# Patient Record
Sex: Male | Born: 1940 | Race: White | Hispanic: No | Marital: Married | Smoking: Former smoker
Health system: Southern US, Community
[De-identification: ages and names within clinical notes are randomized; demographics above are authoritative.]

## PROBLEM LIST (undated history)

## (undated) DIAGNOSIS — N400 Enlarged prostate without lower urinary tract symptoms: Secondary | ICD-10-CM

## (undated) DIAGNOSIS — K5792 Diverticulitis of intestine, part unspecified, without perforation or abscess without bleeding: Secondary | ICD-10-CM

## (undated) DIAGNOSIS — R51 Headache: Secondary | ICD-10-CM

## (undated) DIAGNOSIS — F0390 Unspecified dementia without behavioral disturbance: Secondary | ICD-10-CM

## (undated) HISTORY — DX: Headache: R51

## (undated) HISTORY — PX: CHOLECYSTECTOMY: SHX55

## (undated) HISTORY — DX: Benign prostatic hyperplasia without lower urinary tract symptoms: N40.0

## (undated) HISTORY — PX: SMALL INTESTINE SURGERY: SHX150

## (undated) HISTORY — PX: HERNIA REPAIR: SHX51

## (undated) HISTORY — PX: TOTAL KNEE ARTHROPLASTY: SHX125

## (undated) HISTORY — PX: JOINT REPLACEMENT: SHX530

---

## 2001-01-26 ENCOUNTER — Other Ambulatory Visit: Admission: RE | Admit: 2001-01-26 | Discharge: 2001-01-26 | Payer: Self-pay | Admitting: Pediatrics

## 2003-07-07 ENCOUNTER — Emergency Department (HOSPITAL_COMMUNITY): Admission: EM | Admit: 2003-07-07 | Discharge: 2003-07-07 | Payer: Self-pay | Admitting: Emergency Medicine

## 2003-07-12 ENCOUNTER — Ambulatory Visit (HOSPITAL_COMMUNITY): Admission: RE | Admit: 2003-07-12 | Discharge: 2003-07-12 | Payer: Self-pay | Admitting: Pediatrics

## 2003-11-04 ENCOUNTER — Ambulatory Visit (HOSPITAL_COMMUNITY): Admission: RE | Admit: 2003-11-04 | Discharge: 2003-11-04 | Payer: Self-pay | Admitting: General Surgery

## 2003-11-06 ENCOUNTER — Ambulatory Visit (HOSPITAL_COMMUNITY): Admission: RE | Admit: 2003-11-06 | Discharge: 2003-11-06 | Payer: Self-pay | Admitting: General Surgery

## 2005-04-30 ENCOUNTER — Emergency Department (HOSPITAL_COMMUNITY): Admission: EM | Admit: 2005-04-30 | Discharge: 2005-04-30 | Payer: Self-pay | Admitting: Emergency Medicine

## 2005-05-07 ENCOUNTER — Ambulatory Visit (HOSPITAL_COMMUNITY): Admission: RE | Admit: 2005-05-07 | Discharge: 2005-05-07 | Payer: Self-pay | Admitting: General Surgery

## 2006-05-04 ENCOUNTER — Encounter (INDEPENDENT_AMBULATORY_CARE_PROVIDER_SITE_OTHER): Payer: Self-pay | Admitting: Family Medicine

## 2006-05-04 LAB — CONVERTED CEMR LAB: Blood Glucose, Fasting: 90 mg/dL

## 2006-05-27 ENCOUNTER — Encounter (INDEPENDENT_AMBULATORY_CARE_PROVIDER_SITE_OTHER): Payer: Self-pay | Admitting: *Deleted

## 2006-05-27 ENCOUNTER — Ambulatory Visit: Payer: Self-pay | Admitting: Family Medicine

## 2006-05-31 ENCOUNTER — Ambulatory Visit (HOSPITAL_COMMUNITY): Admission: RE | Admit: 2006-05-31 | Discharge: 2006-05-31 | Payer: Self-pay | Admitting: Family Medicine

## 2006-05-31 ENCOUNTER — Encounter (INDEPENDENT_AMBULATORY_CARE_PROVIDER_SITE_OTHER): Payer: Self-pay | Admitting: Family Medicine

## 2006-05-31 LAB — CONVERTED CEMR LAB
Albumin: 4.3 g/dL
Alkaline Phosphatase: 51 units/L
BUN: 13 mg/dL
Calcium: 9.1 mg/dL
Cholesterol: 191 mg/dL
Creatinine, Ser: 0.98 mg/dL
HDL: 46 mg/dL
LDL Cholesterol: 122 mg/dL
PSA: 0.27 ng/mL
PSA: 0.27 ng/mL
RBC count: 4.88 10*6/uL
TSH: 1.302 microintl units/mL
TSH: 1.302 microintl units/mL
WBC, blood: 9.9 10*3/uL

## 2006-06-13 ENCOUNTER — Ambulatory Visit: Payer: Self-pay | Admitting: Family Medicine

## 2006-07-02 ENCOUNTER — Ambulatory Visit: Admission: RE | Admit: 2006-07-02 | Discharge: 2006-07-02 | Payer: Self-pay | Admitting: Family Medicine

## 2006-07-15 ENCOUNTER — Ambulatory Visit: Payer: Self-pay | Admitting: Family Medicine

## 2006-07-19 ENCOUNTER — Encounter: Payer: Self-pay | Admitting: Family Medicine

## 2006-07-19 DIAGNOSIS — F411 Generalized anxiety disorder: Secondary | ICD-10-CM | POA: Insufficient documentation

## 2006-07-19 DIAGNOSIS — M1990 Unspecified osteoarthritis, unspecified site: Secondary | ICD-10-CM | POA: Insufficient documentation

## 2006-07-19 DIAGNOSIS — K429 Umbilical hernia without obstruction or gangrene: Secondary | ICD-10-CM | POA: Insufficient documentation

## 2006-07-19 DIAGNOSIS — K439 Ventral hernia without obstruction or gangrene: Secondary | ICD-10-CM | POA: Insufficient documentation

## 2006-07-19 DIAGNOSIS — E669 Obesity, unspecified: Secondary | ICD-10-CM

## 2006-07-19 DIAGNOSIS — E785 Hyperlipidemia, unspecified: Secondary | ICD-10-CM | POA: Insufficient documentation

## 2006-07-19 DIAGNOSIS — K5900 Constipation, unspecified: Secondary | ICD-10-CM

## 2006-07-19 DIAGNOSIS — Z8719 Personal history of other diseases of the digestive system: Secondary | ICD-10-CM | POA: Insufficient documentation

## 2006-07-19 DIAGNOSIS — K219 Gastro-esophageal reflux disease without esophagitis: Secondary | ICD-10-CM

## 2006-07-19 DIAGNOSIS — M545 Low back pain: Secondary | ICD-10-CM

## 2006-07-21 ENCOUNTER — Ambulatory Visit: Payer: Self-pay | Admitting: Pulmonary Disease

## 2006-07-22 ENCOUNTER — Observation Stay (HOSPITAL_COMMUNITY): Admission: RE | Admit: 2006-07-22 | Discharge: 2006-07-22 | Payer: Self-pay | Admitting: General Surgery

## 2006-07-23 ENCOUNTER — Emergency Department (HOSPITAL_COMMUNITY): Admission: EM | Admit: 2006-07-23 | Discharge: 2006-07-23 | Payer: Self-pay | Admitting: Emergency Medicine

## 2006-08-01 ENCOUNTER — Inpatient Hospital Stay (HOSPITAL_COMMUNITY): Admission: EM | Admit: 2006-08-01 | Discharge: 2006-08-02 | Payer: Self-pay | Admitting: Emergency Medicine

## 2006-08-02 HISTORY — PX: COLONOSCOPY: SHX174

## 2006-09-15 ENCOUNTER — Ambulatory Visit: Payer: Self-pay | Admitting: Family Medicine

## 2006-09-15 DIAGNOSIS — G4733 Obstructive sleep apnea (adult) (pediatric): Secondary | ICD-10-CM

## 2006-09-16 ENCOUNTER — Encounter (INDEPENDENT_AMBULATORY_CARE_PROVIDER_SITE_OTHER): Payer: Self-pay | Admitting: Family Medicine

## 2006-09-23 ENCOUNTER — Telehealth (INDEPENDENT_AMBULATORY_CARE_PROVIDER_SITE_OTHER): Payer: Self-pay | Admitting: Family Medicine

## 2006-09-28 ENCOUNTER — Ambulatory Visit: Payer: Self-pay | Admitting: Family Medicine

## 2006-09-28 ENCOUNTER — Telehealth (INDEPENDENT_AMBULATORY_CARE_PROVIDER_SITE_OTHER): Payer: Self-pay | Admitting: Family Medicine

## 2006-09-28 LAB — CONVERTED CEMR LAB
Inflenza A Ag: NEGATIVE
Rapid Strep: NEGATIVE

## 2006-10-19 ENCOUNTER — Telehealth (INDEPENDENT_AMBULATORY_CARE_PROVIDER_SITE_OTHER): Payer: Self-pay | Admitting: Family Medicine

## 2006-10-20 ENCOUNTER — Ambulatory Visit: Payer: Self-pay | Admitting: Family Medicine

## 2006-10-21 ENCOUNTER — Encounter (INDEPENDENT_AMBULATORY_CARE_PROVIDER_SITE_OTHER): Payer: Self-pay | Admitting: Family Medicine

## 2006-11-03 ENCOUNTER — Encounter (INDEPENDENT_AMBULATORY_CARE_PROVIDER_SITE_OTHER): Payer: Self-pay | Admitting: Family Medicine

## 2006-11-08 ENCOUNTER — Ambulatory Visit: Payer: Self-pay | Admitting: Family Medicine

## 2006-11-08 LAB — CONVERTED CEMR LAB
Bilirubin Urine: NEGATIVE
Glucose, Urine, Semiquant: NEGATIVE
Ketones, urine, test strip: NEGATIVE
Nitrite: NEGATIVE
Protein, U semiquant: NEGATIVE
Specific Gravity, Urine: 1.02
Urobilinogen, UA: 0.2
WBC Urine, dipstick: NEGATIVE
pH: 7.5

## 2006-11-11 ENCOUNTER — Ambulatory Visit (HOSPITAL_COMMUNITY): Admission: RE | Admit: 2006-11-11 | Discharge: 2006-11-11 | Payer: Self-pay | Admitting: Family Medicine

## 2006-11-16 ENCOUNTER — Telehealth (INDEPENDENT_AMBULATORY_CARE_PROVIDER_SITE_OTHER): Payer: Self-pay | Admitting: *Deleted

## 2006-11-17 ENCOUNTER — Ambulatory Visit: Payer: Self-pay | Admitting: Family Medicine

## 2006-11-18 ENCOUNTER — Encounter (INDEPENDENT_AMBULATORY_CARE_PROVIDER_SITE_OTHER): Payer: Self-pay | Admitting: Family Medicine

## 2006-11-22 ENCOUNTER — Ambulatory Visit: Payer: Self-pay | Admitting: Internal Medicine

## 2006-11-22 ENCOUNTER — Encounter (INDEPENDENT_AMBULATORY_CARE_PROVIDER_SITE_OTHER): Payer: Self-pay | Admitting: Family Medicine

## 2006-11-28 ENCOUNTER — Ambulatory Visit (HOSPITAL_COMMUNITY): Admission: RE | Admit: 2006-11-28 | Discharge: 2006-11-28 | Payer: Self-pay | Admitting: Internal Medicine

## 2006-11-28 ENCOUNTER — Ambulatory Visit: Payer: Self-pay | Admitting: Internal Medicine

## 2006-11-28 ENCOUNTER — Encounter (INDEPENDENT_AMBULATORY_CARE_PROVIDER_SITE_OTHER): Payer: Self-pay | Admitting: Specialist

## 2007-01-16 ENCOUNTER — Ambulatory Visit: Payer: Self-pay | Admitting: Internal Medicine

## 2007-01-16 ENCOUNTER — Encounter (INDEPENDENT_AMBULATORY_CARE_PROVIDER_SITE_OTHER): Payer: Self-pay | Admitting: Family Medicine

## 2007-01-23 ENCOUNTER — Telehealth (INDEPENDENT_AMBULATORY_CARE_PROVIDER_SITE_OTHER): Payer: Self-pay | Admitting: Family Medicine

## 2007-02-16 ENCOUNTER — Ambulatory Visit: Payer: Self-pay | Admitting: Family Medicine

## 2007-02-17 ENCOUNTER — Ambulatory Visit (HOSPITAL_COMMUNITY): Admission: RE | Admit: 2007-02-17 | Discharge: 2007-02-17 | Payer: Self-pay | Admitting: Family Medicine

## 2007-02-18 ENCOUNTER — Encounter (INDEPENDENT_AMBULATORY_CARE_PROVIDER_SITE_OTHER): Payer: Self-pay | Admitting: Family Medicine

## 2007-02-19 LAB — CONVERTED CEMR LAB
ALT: 21 units/L (ref 0–53)
AST: 23 units/L (ref 0–37)
Albumin: 4.5 g/dL (ref 3.5–5.2)
Alkaline Phosphatase: 70 units/L (ref 39–117)
Amylase: 64 units/L (ref 0–105)
BUN: 13 mg/dL (ref 6–23)
Basophils Absolute: 0.1 10*3/uL (ref 0.0–0.1)
Basophils Relative: 1 % (ref 0–1)
CO2: 22 meq/L (ref 19–32)
Calcium: 9.6 mg/dL (ref 8.4–10.5)
Chloride: 106 meq/L (ref 96–112)
Creatinine, Ser: 0.98 mg/dL (ref 0.40–1.50)
Eosinophils Absolute: 0.3 10*3/uL (ref 0.0–0.7)
Eosinophils Relative: 3 % (ref 0–5)
Glucose, Bld: 103 mg/dL — ABNORMAL HIGH (ref 70–99)
HCT: 47.1 % (ref 39.0–52.0)
Hemoglobin: 15 g/dL (ref 13.0–17.0)
Lipase: 34 units/L (ref 0–75)
Lymphocytes Relative: 31 % (ref 12–46)
Lymphs Abs: 3.2 10*3/uL (ref 0.7–3.3)
MCHC: 31.8 g/dL (ref 30.0–36.0)
MCV: 99.4 fL (ref 78.0–100.0)
Monocytes Absolute: 0.7 10*3/uL (ref 0.2–0.7)
Monocytes Relative: 7 % (ref 3–11)
Neutro Abs: 6.1 10*3/uL (ref 1.7–7.7)
Neutrophils Relative %: 59 % (ref 43–77)
Platelets: 247 10*3/uL (ref 150–400)
Potassium: 4.4 meq/L (ref 3.5–5.3)
RBC: 4.74 M/uL (ref 4.22–5.81)
RDW: 13.8 % (ref 11.5–14.0)
Sodium: 143 meq/L (ref 135–145)
Total Bilirubin: 0.5 mg/dL (ref 0.3–1.2)
Total Protein: 7.6 g/dL (ref 6.0–8.3)
WBC: 10.4 10*3/uL (ref 4.0–10.5)

## 2007-02-22 ENCOUNTER — Ambulatory Visit: Payer: Self-pay | Admitting: Cardiovascular Disease

## 2007-02-24 ENCOUNTER — Ambulatory Visit: Payer: Self-pay | Admitting: Internal Medicine

## 2007-02-24 ENCOUNTER — Ambulatory Visit (HOSPITAL_COMMUNITY): Admission: RE | Admit: 2007-02-24 | Discharge: 2007-02-24 | Payer: Self-pay | Admitting: Cardiovascular Disease

## 2007-03-25 ENCOUNTER — Inpatient Hospital Stay (HOSPITAL_COMMUNITY): Admission: RE | Admit: 2007-03-25 | Discharge: 2007-03-27 | Payer: Self-pay | Admitting: General Surgery

## 2007-06-22 ENCOUNTER — Encounter (INDEPENDENT_AMBULATORY_CARE_PROVIDER_SITE_OTHER): Payer: Self-pay | Admitting: Family Medicine

## 2007-07-12 ENCOUNTER — Ambulatory Visit: Payer: Self-pay | Admitting: Family Medicine

## 2007-07-12 ENCOUNTER — Telehealth (INDEPENDENT_AMBULATORY_CARE_PROVIDER_SITE_OTHER): Payer: Self-pay | Admitting: *Deleted

## 2007-07-19 ENCOUNTER — Encounter (INDEPENDENT_AMBULATORY_CARE_PROVIDER_SITE_OTHER): Payer: Self-pay | Admitting: Family Medicine

## 2007-07-20 ENCOUNTER — Telehealth (INDEPENDENT_AMBULATORY_CARE_PROVIDER_SITE_OTHER): Payer: Self-pay | Admitting: *Deleted

## 2007-07-20 LAB — CONVERTED CEMR LAB
ALT: 17 units/L (ref 0–53)
AST: 18 units/L (ref 0–37)
Basophils Absolute: 0.1 10*3/uL (ref 0.0–0.1)
Basophils Relative: 1 % (ref 0–1)
CO2: 26 meq/L (ref 19–32)
Calcium: 9.1 mg/dL (ref 8.4–10.5)
Chloride: 104 meq/L (ref 96–112)
Cholesterol: 196 mg/dL (ref 0–200)
Eosinophils Relative: 3 % (ref 0–5)
HCT: 48.6 % (ref 39.0–52.0)
Lymphs Abs: 3.8 10*3/uL (ref 0.7–4.0)
MCV: 96 fL (ref 78.0–100.0)
Monocytes Absolute: 0.9 10*3/uL (ref 0.1–1.0)
Monocytes Relative: 8 % (ref 3–12)
PSA: 0.53 ng/mL (ref 0.10–4.00)
Potassium: 5.3 meq/L (ref 3.5–5.3)
RBC: 5.06 M/uL (ref 4.22–5.81)
Sodium: 141 meq/L (ref 135–145)
TSH: 1.565 microintl units/mL (ref 0.350–5.50)
Total CHOL/HDL Ratio: 4.3
Total Protein: 7.5 g/dL (ref 6.0–8.3)
VLDL: 36 mg/dL (ref 0–40)
WBC: 11.4 10*3/uL — ABNORMAL HIGH (ref 4.0–10.5)

## 2007-08-16 ENCOUNTER — Ambulatory Visit: Payer: Self-pay | Admitting: Family Medicine

## 2007-08-16 ENCOUNTER — Telehealth (INDEPENDENT_AMBULATORY_CARE_PROVIDER_SITE_OTHER): Payer: Self-pay | Admitting: *Deleted

## 2007-09-21 ENCOUNTER — Encounter (INDEPENDENT_AMBULATORY_CARE_PROVIDER_SITE_OTHER): Payer: Self-pay | Admitting: Family Medicine

## 2007-11-14 ENCOUNTER — Ambulatory Visit: Payer: Self-pay | Admitting: Family Medicine

## 2007-11-14 ENCOUNTER — Telehealth (INDEPENDENT_AMBULATORY_CARE_PROVIDER_SITE_OTHER): Payer: Self-pay | Admitting: *Deleted

## 2007-11-16 ENCOUNTER — Encounter (INDEPENDENT_AMBULATORY_CARE_PROVIDER_SITE_OTHER): Payer: Self-pay | Admitting: Family Medicine

## 2008-01-01 ENCOUNTER — Ambulatory Visit (HOSPITAL_COMMUNITY): Admission: RE | Admit: 2008-01-01 | Discharge: 2008-01-01 | Payer: Self-pay | Admitting: Family Medicine

## 2008-01-01 ENCOUNTER — Ambulatory Visit: Payer: Self-pay | Admitting: Family Medicine

## 2008-01-01 DIAGNOSIS — R030 Elevated blood-pressure reading, without diagnosis of hypertension: Secondary | ICD-10-CM

## 2008-01-02 ENCOUNTER — Ambulatory Visit: Payer: Self-pay | Admitting: Family Medicine

## 2008-01-03 ENCOUNTER — Encounter (INDEPENDENT_AMBULATORY_CARE_PROVIDER_SITE_OTHER): Payer: Self-pay | Admitting: Family Medicine

## 2008-01-03 ENCOUNTER — Ambulatory Visit: Payer: Self-pay | Admitting: Cardiology

## 2008-01-03 ENCOUNTER — Ambulatory Visit (HOSPITAL_COMMUNITY): Admission: RE | Admit: 2008-01-03 | Discharge: 2008-01-03 | Payer: Self-pay | Admitting: Family Medicine

## 2008-01-03 LAB — CONVERTED CEMR LAB
AST: 17 units/L (ref 0–37)
Alkaline Phosphatase: 60 units/L (ref 39–117)
BUN: 14 mg/dL (ref 6–23)
Glucose, Bld: 84 mg/dL (ref 70–99)
HDL: 37 mg/dL — ABNORMAL LOW (ref >39)
LDL Cholesterol: 98 mg/dL (ref 0–99)
Potassium: 4.7 meq/L (ref 3.5–5.3)
TSH: 1.037 microintl units/mL (ref 0.350–5.50)
Total Bilirubin: 0.5 mg/dL (ref 0.3–1.2)
Total CHOL/HDL Ratio: 4.4
Triglycerides: 130 mg/dL (ref <150)
VLDL: 26 mg/dL (ref 0–40)

## 2008-01-05 ENCOUNTER — Ambulatory Visit: Payer: Self-pay | Admitting: Cardiology

## 2008-01-05 ENCOUNTER — Ambulatory Visit: Payer: Self-pay | Admitting: Family Medicine

## 2008-01-11 ENCOUNTER — Ambulatory Visit (HOSPITAL_COMMUNITY): Admission: RE | Admit: 2008-01-11 | Discharge: 2008-01-11 | Payer: Self-pay | Admitting: Pediatrics

## 2008-02-09 ENCOUNTER — Ambulatory Visit: Payer: Self-pay | Admitting: Family Medicine

## 2008-02-09 DIAGNOSIS — R109 Unspecified abdominal pain: Secondary | ICD-10-CM | POA: Insufficient documentation

## 2008-05-24 ENCOUNTER — Ambulatory Visit: Payer: Self-pay | Admitting: Family Medicine

## 2008-05-24 DIAGNOSIS — N400 Enlarged prostate without lower urinary tract symptoms: Secondary | ICD-10-CM | POA: Insufficient documentation

## 2008-05-24 DIAGNOSIS — F528 Other sexual dysfunction not due to a substance or known physiological condition: Secondary | ICD-10-CM

## 2008-05-25 ENCOUNTER — Encounter (INDEPENDENT_AMBULATORY_CARE_PROVIDER_SITE_OTHER): Payer: Self-pay | Admitting: Family Medicine

## 2008-06-04 ENCOUNTER — Ambulatory Visit: Payer: Self-pay | Admitting: Family Medicine

## 2008-06-04 DIAGNOSIS — E291 Testicular hypofunction: Secondary | ICD-10-CM

## 2008-08-15 IMAGING — CT CT PELVIS WO/W CM
2 of 4 series · 12 of 32 positions shown, 18 images · IV contrast (omnipaque)
Comparison: 05/07/05.

CLINICAL DATA: Left abdominal mass.   Evaluate for hernia.  Abdominal pain.  History of cholecystectomy and appendectomy.  
ABDOMEN CT WITH AND WITHOUT CONTRAST:
TECHNIQUE: Multidetector CT imaging of the abdomen was performed following the standard protocol before and after the bolus administration of intravenous contrast. 
Contrast:   150 cc Omnipaque 300 IV.
TECHNIQUE: Multidetector CT imaging of the pelvis was performed following the standard protocol before and after the bolus administration of intravenous contrast.

[Series 2084: — · axial · 0.88mm/px · z∈[+1796,+1850]mm · 2 of 35 slices shown (1 of 2)]
[im 12/35  soft-tissue]
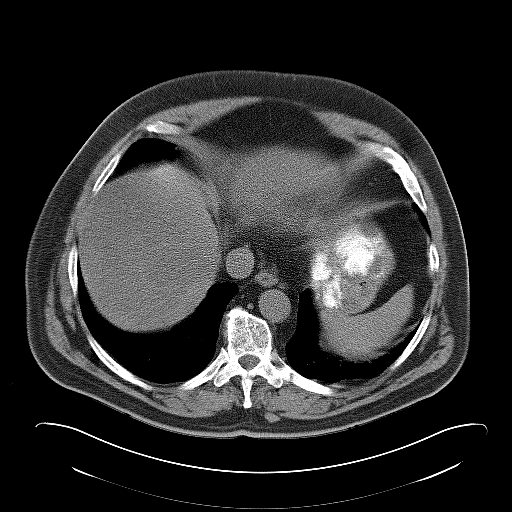
[im 23/35  soft-tissue]
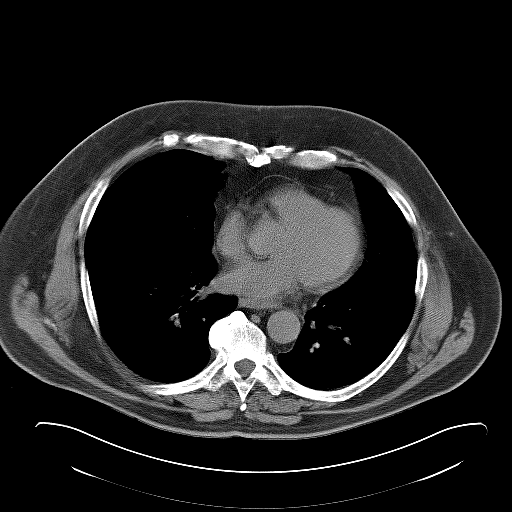

[Series 2087: — · axial · 0.88mm/px · z∈[+1417,+1862]mm · 10 of 109 slices shown, 16 images (2 of 2)]
[im 10/109  soft-tissue]
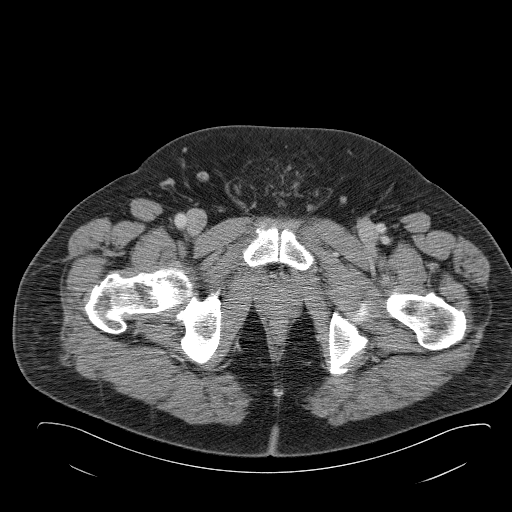
[im 10/109  bone]
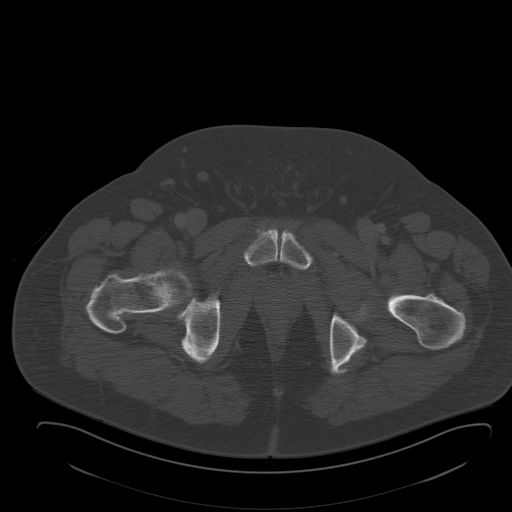
[im 20/109  soft-tissue]
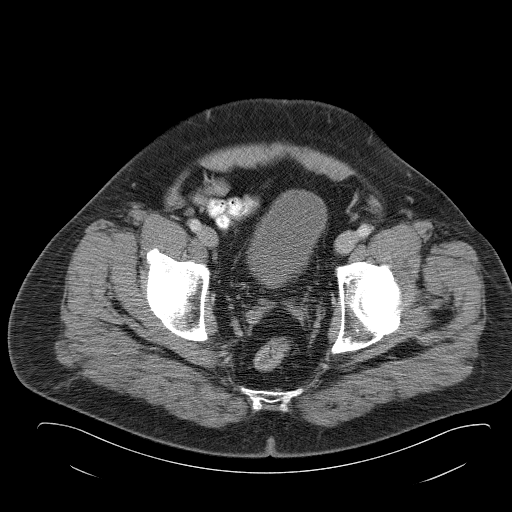
[im 30/109  soft-tissue]
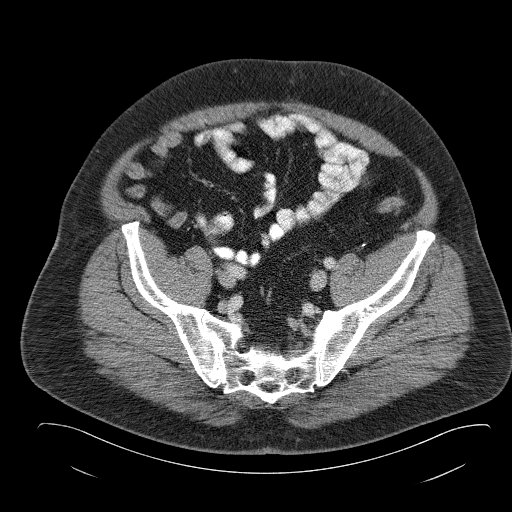
[im 40/109  soft-tissue]
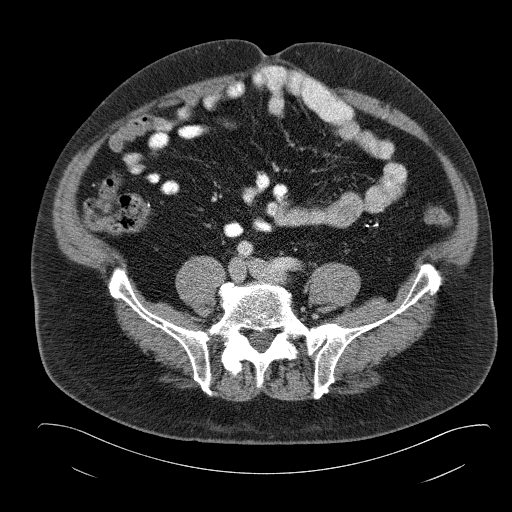
[im 50/109  soft-tissue]
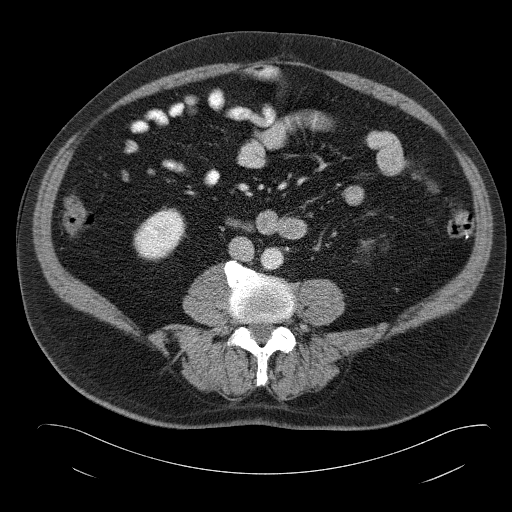
[im 59/109  soft-tissue]
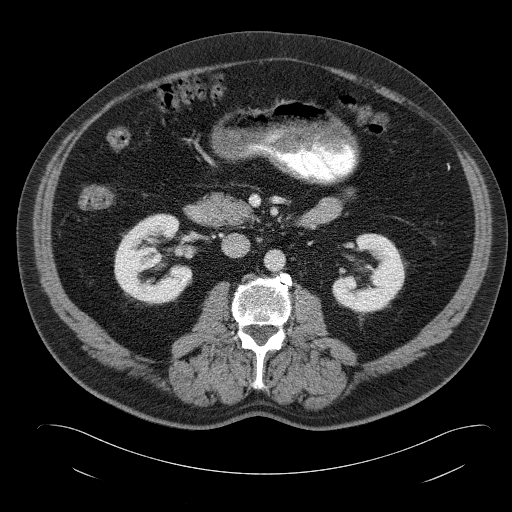
[im 69/109  soft-tissue]
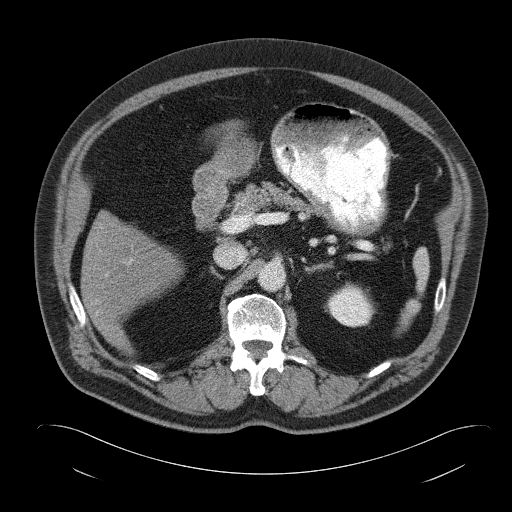
[im 69/109  lung]
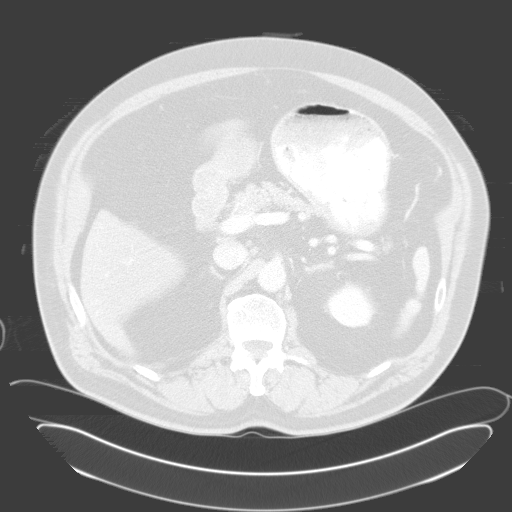
[im 79/109  soft-tissue]
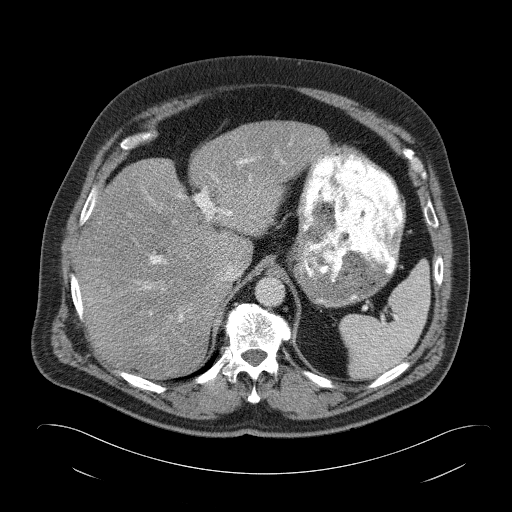
[im 79/109  lung]
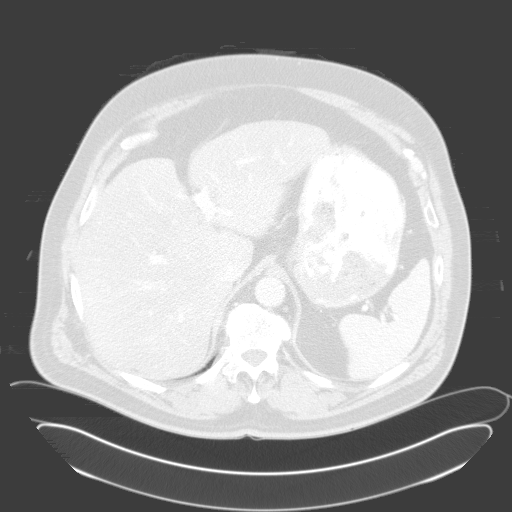
[im 89/109  soft-tissue]
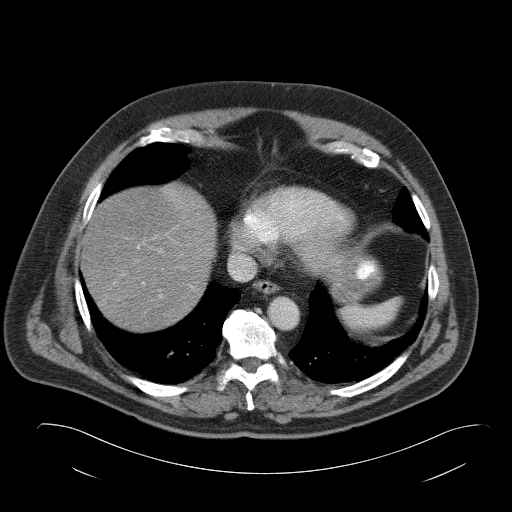
[im 89/109  lung]
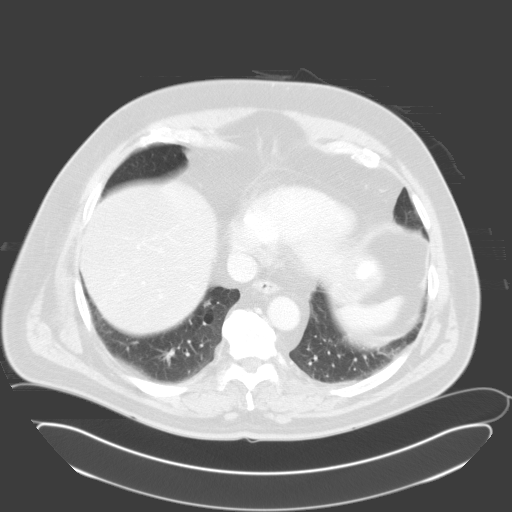
[im 89/109  bone]
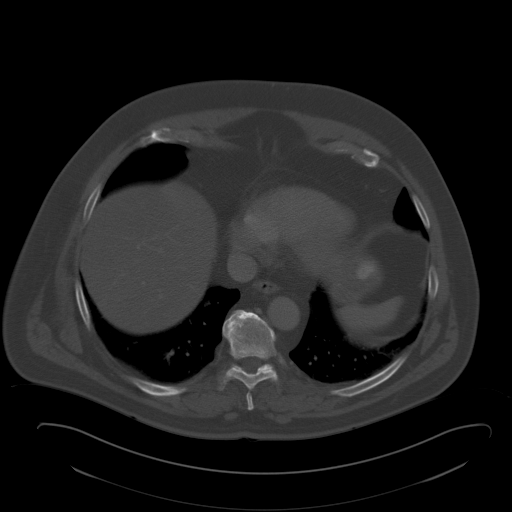
[im 99/109  soft-tissue]
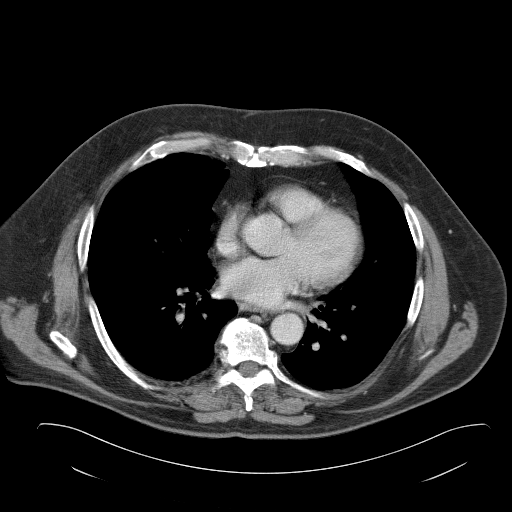
[im 99/109  lung]
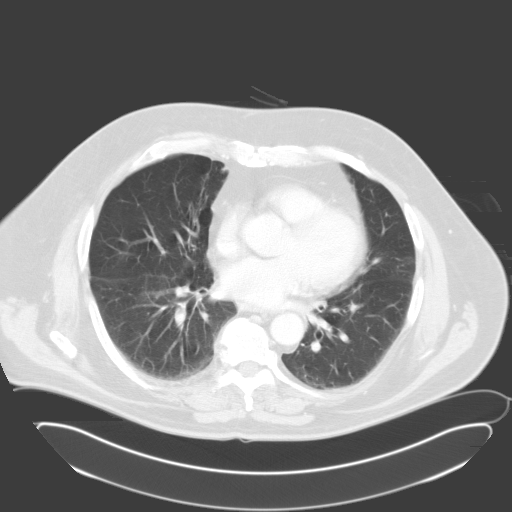

[12 of 32 positions shown; findings below may reference images not displayed]

FINDINGS: The lung bases are clear.  Diffuse fatty infiltration of the liver is again noted and unchanged.  The spleen, pancreas, and both adrenal glands have a normal appearance.  There is a stable 2 cm angiomyolipoma at the lower pole of the left kidney and tiny stable renal cortical cysts bilaterally.  olonic diverticula are noted but there is no radiographic evidence of diverticulitis.  There is no evidence of bowel obstruction.   There are two small ventral hernias containing fatty tissue only which are unchanged.  These are on images #59 and 67.
IMPRESSION: 1.  Two small, stable ventral hernias containing fat only. 
2.  Stable angiomyolipoma on the left kidney.  
3.  Diverticulosis without radiographic evidence of diverticulitis.  
PELVIS WITH AND WITHOUT CONTRAST:
FINDINGS: Surgical clips are seen within the left hemipelvis.  The urinary bladder has an unremarkable appearance.  There is no free pelvic fluid or adenopathy.  The osseous structures are unremarkable except for degenerative changes within the axial spine.  Several small colonic diverticula are seen, but there is no radiographic evidence of diverticulitis.
IMPRESSION: Diverticulosis without radiographic evidence of diverticulitis.

## 2008-09-01 ENCOUNTER — Encounter (INDEPENDENT_AMBULATORY_CARE_PROVIDER_SITE_OTHER): Payer: Self-pay | Admitting: Family Medicine

## 2008-09-05 ENCOUNTER — Ambulatory Visit: Payer: Self-pay | Admitting: Family Medicine

## 2008-09-05 DIAGNOSIS — M1710 Unilateral primary osteoarthritis, unspecified knee: Secondary | ICD-10-CM

## 2008-09-12 ENCOUNTER — Encounter (INDEPENDENT_AMBULATORY_CARE_PROVIDER_SITE_OTHER): Payer: Self-pay | Admitting: Family Medicine

## 2008-09-13 LAB — CONVERTED CEMR LAB
AST: 19 units/L (ref 0–37)
Alkaline Phosphatase: 60 units/L (ref 39–117)
Basophils Absolute: 0.1 10*3/uL (ref 0.0–0.1)
Basophils Relative: 0 % (ref 0–1)
Eosinophils Absolute: 0.4 10*3/uL (ref 0.0–0.7)
Eosinophils Relative: 3 % (ref 0–5)
Glucose, Bld: 91 mg/dL (ref 70–99)
HCT: 46.5 % (ref 39.0–52.0)
Lymphs Abs: 4.5 10*3/uL — ABNORMAL HIGH (ref 0.7–4.0)
MCHC: 33.3 g/dL (ref 30.0–36.0)
MCV: 95.9 fL (ref 78.0–100.0)
Neutrophils Relative %: 54 % (ref 43–77)
PSA: 0.39 ng/mL (ref 0.10–4.00)
Platelets: 257 10*3/uL (ref 150–400)
RDW: 13.4 % (ref 11.5–15.5)
Sodium: 140 meq/L (ref 135–145)
Total Bilirubin: 0.6 mg/dL (ref 0.3–1.2)
Total Protein: 7.9 g/dL (ref 6.0–8.3)

## 2008-09-20 ENCOUNTER — Telehealth (INDEPENDENT_AMBULATORY_CARE_PROVIDER_SITE_OTHER): Payer: Self-pay | Admitting: Family Medicine

## 2008-10-03 ENCOUNTER — Ambulatory Visit: Payer: Self-pay | Admitting: Family Medicine

## 2008-10-03 DIAGNOSIS — G250 Essential tremor: Secondary | ICD-10-CM

## 2008-10-04 ENCOUNTER — Encounter (INDEPENDENT_AMBULATORY_CARE_PROVIDER_SITE_OTHER): Payer: Self-pay | Admitting: Internal Medicine

## 2008-11-06 ENCOUNTER — Encounter (INDEPENDENT_AMBULATORY_CARE_PROVIDER_SITE_OTHER): Payer: Self-pay | Admitting: Family Medicine

## 2008-12-06 ENCOUNTER — Encounter (INDEPENDENT_AMBULATORY_CARE_PROVIDER_SITE_OTHER): Payer: Self-pay | Admitting: Family Medicine

## 2009-01-17 ENCOUNTER — Ambulatory Visit: Payer: Self-pay | Admitting: Family Medicine

## 2009-03-19 ENCOUNTER — Ambulatory Visit: Payer: Self-pay | Admitting: Family Medicine

## 2009-03-19 DIAGNOSIS — R51 Headache: Secondary | ICD-10-CM

## 2009-03-19 DIAGNOSIS — R519 Headache, unspecified: Secondary | ICD-10-CM | POA: Insufficient documentation

## 2009-03-20 ENCOUNTER — Telehealth (INDEPENDENT_AMBULATORY_CARE_PROVIDER_SITE_OTHER): Payer: Self-pay | Admitting: *Deleted

## 2009-05-04 IMAGING — CR DG CHEST 2V
2 series · 2 of 2 positions shown · non-contrast
Comparison: 07/31/2006.

CLINICAL DATA: Chest pain

CHEST - 2 VIEW

[view not recorded (1 of 2)]
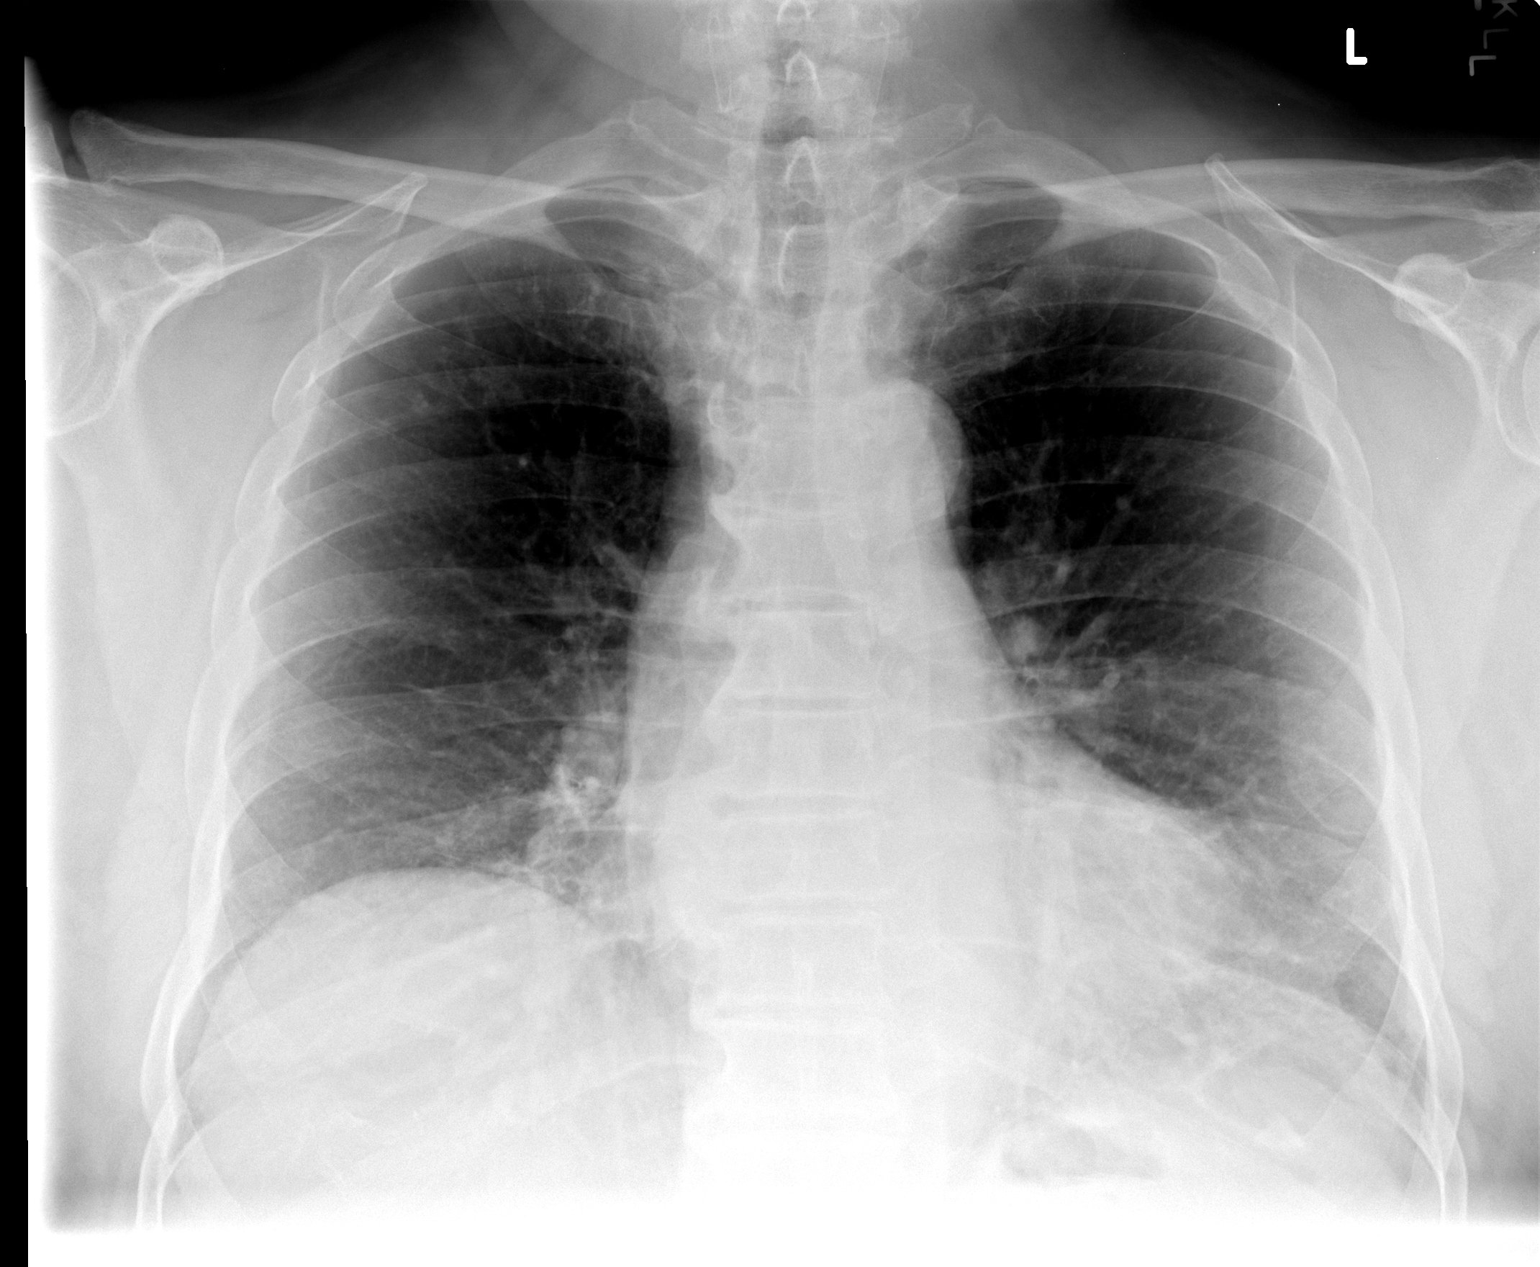

[view not recorded (2 of 2)]
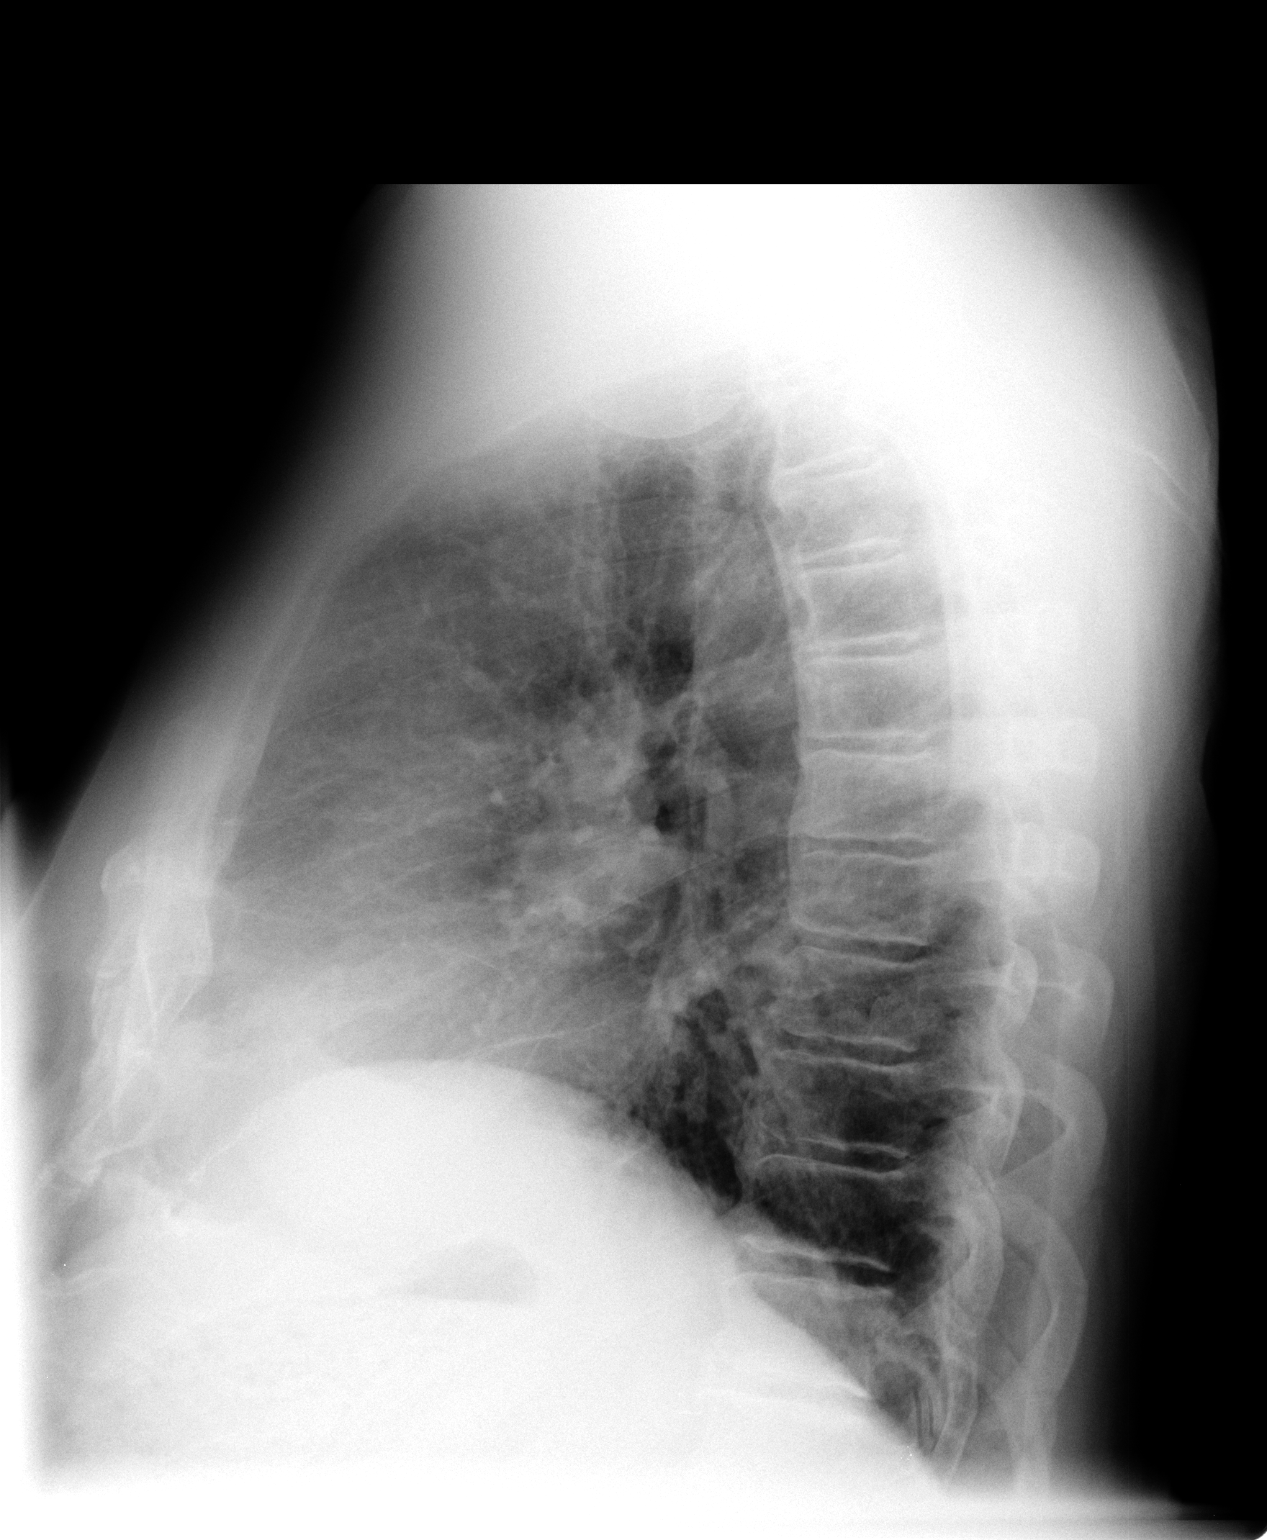

[2 of 2 positions shown; findings below may reference images not displayed]

FINDINGS: Heart size at upper limits of normal. Lung volumes slightly low.
Aortic arch atherosclerosis. Bibasilar atelectasis. No airspace disease, edema,
or effusions. No pneumothorax. Changes of dish identified in the thoracic spine.
Degenerative changes noted about the shoulders.

IMPRESSION
1. Heart size upper limits of normal.
2. Bibasilar atelectasis.

## 2009-08-25 ENCOUNTER — Ambulatory Visit (HOSPITAL_COMMUNITY): Admission: RE | Admit: 2009-08-25 | Discharge: 2009-08-25 | Payer: Self-pay | Admitting: Urology

## 2010-08-30 LAB — CONVERTED CEMR LAB
Bilirubin Urine: NEGATIVE
Blood in Urine, dipstick: NEGATIVE
Cholesterol, target level: 200 mg/dL
HDL goal, serum: 40 mg/dL
LDL Goal: 160 mg/dL
Specific Gravity, Urine: 1.025
Urobilinogen, UA: 0.2
WBC Urine, dipstick: NEGATIVE

## 2010-12-15 NOTE — Assessment & Plan Note (Signed)
NAMEMarland Kitchen  ABDULRAHMAN, BRACEY              CHART#:  [id number]   DATE:  01/16/2007                       DOB:  1941-06-28   CHIEF COMPLAINT:  Followup colonoscopy/abdominal pain.   SUBJECTIVE:  Lawrence Reynolds is a 70 year old Caucasian male who is status  post colonoscopy. He has been having history of chronic migratory  abdominal pain for years. Recently had a ventral hernia repair. He  underwent colonoscopy by Dr. Jena Gauss on November 28, 2006. He was found to  have a diminutive rectal polyp, which was hyperplastic. He had a  polypectomy at the splenic flexure, which was adenomatous and a cecal  polyp, which was benign. He was also found to have diverticulosis. He  continues to have intermittent abdominal pain but tells me that he has  not had any pain or any severe pain since the colonoscopy. He  occasionally gets twinges of pressure where his ventral hernia repair  was done. He tells me that his bowel movements are normal, soft, and  brown. Denies any rectal bleeding or melena. His weight is steadily  increasing. He denies any anorexia or early satiety.   CURRENT MEDICATIONS:  See the list from January 16, 2007.   ALLERGIES:  DARVOCET.   OBJECTIVE:  VITAL SIGNS:  Weight 266 pounds. Height 69 inches.  Temperature 98.2, blood pressure 120/72, pulse 118.  GENERAL:  Lawrence Reynolds is an obese, Caucasian male who is alert,  oriented, pleasant, and cooperative. No acute distress.  HEENT:  Sclerae clear. Nonicteric. Conjunctivae pink. Oropharynx pink  and moist without lesions.  CHEST:  Regular rate and rhythm. Normal S1 and S2.  ABDOMEN:  Protuberant with positive bowel sounds x4. No bruits  auscultated. He does have a large ventral hernia scar with palpable  distention and full to just left of the midline. There is no rebound  tenderness or guarding. No hepatosplenomegaly or mass, except possible  hernia at ventral hernia repair site.  EXTREMITIES:  Without clubbing or edema bilaterally.   ASSESSMENT:  Lawrence Reynolds is a 70 year old Caucasian male with history of  migratory abdominal pain. Recent ventral hernia repair site with some  tenderness. I have offered CT for further evaluation of his abdominal  pain and to rule out recurrent hernia. However, at this time, he is  declining as he says that his abdominal pain has been quite infrequent  recently. He is asked to keep a watch on this area. If he develops  severe abdominal pain again, he is going to give [place] a call and we will  proceed with further evaluation.   PLAN:  1. If abdominal pain returns, would pursue CT scan of the abdomen and      pelvis with IV and      oral contrast.  2. Otherwise, followup with colonoscopy in 5 years in April of 2013.       Lorenza Burton, N.P.  Electronically Signed     R. Roetta Sessions, M.D.  Electronically Signed    KJ/MEDQ  D:  01/16/2007  T:  01/16/2007  Job:  [id number]   cc:   Dr. Erby Pian - [place] Ace, N.C.

## 2010-12-15 NOTE — Procedures (Signed)
NAME:  Lawrence Reynolds, Lawrence Reynolds NO.:  [id number]   MEDICAL RECORD NO.:  [id number]          PATIENT TYPE:  OUT   LOCATION:  RAD                           FACILITY:  [facility]   PHYSICIAN:  Pricilla Riffle, MD, FACCDATE OF BIRTH:  May 25, 1941   DATE OF PROCEDURE:  02/24/2007  DATE OF DISCHARGE:                                ECHOCARDIOGRAM   INDICATIONS:  The patient is a 70 year old with history of chest pain,  test to evaluate LV function.   2-D ECHO WITH ECHO DOPPLER:  Left ventricle is normal in size.  End-  diastolic dimension of 46 mm.  The interventricular septum and posterior  wall are mildly thickened at 14 and 13 mm each.  Left atrium is mildly  dilated at 46 mm.  Right atrium and right ventricle are normal.  Aortic  root is normal at 36 mm.   The aortic valve is mildly thickened, not stenotic.  There is no  insufficiency.  Mitral valve is mildly thickened, not stenotic.  There  is no insufficiency.  Pulmonic valve not well seen.  Tricuspid valve is  normal with no insufficiency.   Overall LV systolic function is normal with an LVEF of approximately  55%.  RV EF is normal.  Note there is mild diastolic dysfunction of the  LV.   No pericardial effusion is seen.  IVC is normal.      Pricilla Riffle, MD, [facility]  Electronically Signed     PVR/MEDQ  D:  02/24/2007  T:  02/25/2007  Job:  [id number]   cc:   Franchot Heidelberg, M.D.

## 2010-12-15 NOTE — Discharge Summary (Signed)
NAMEDAVELLE, Lawrence Reynolds NO.:  [id number]   MEDICAL RECORD NO.:  [id number]          PATIENT TYPE:  INP   LOCATION:  A323                          FACILITY:  [facility]   PHYSICIAN:  Dalia Heading, M.D.  DATE OF BIRTH:  08/18/1940   DATE OF ADMISSION:  03/24/2007  DATE OF DISCHARGE:  08/25/2008LH                               DISCHARGE SUMMARY   HOSPITAL COURSE SUMMARY:  The patient is a 70 year old white male who  underwent an incisional herniorrhaphy with mesh.  He tolerated the  procedure well.  Surgery was complicated by a right tonsillar bleed, due  to the intubation.  This quelled down, over the next 24 hours.  He was  placed on clindamycin, to help control any and secondary infection.  The  patient's diet was advanced without difficulty.  He was having flatus,  but has not had a bowel movement yet.  The patient desires to go home,  which is fine with me.   The patient is being discharged home on March 27, 2007, in good  improving condition.   DISCHARGE INSTRUCTIONS:  The patient is to follow up with Dr. Franky Macho on April 04, 2007.   DISCHARGE MEDICATIONS:  Include:  1. Vicodin 1-2 tablets p.o. q.4 h. p.r.n. pain.  2. Flomax 1 tablet p.o. daily.  3. Prozac 1 tablet p.o. daily.  4. MiraLax 17 grams p.o. b.i.d. p.r.n. constipation.  5. Augmentin 1 tablet p.o. b.i.d. x5 days.   PRINCIPAL DIAGNOSES:  1. Recurrent incisional hernia.  2. Depression.  3. Benign prostatic hypertrophy.  4. Tonsillitis.   PRINCIPAL PROCEDURE:  Incisional herniorrhaphy with mesh on March 24, 2007.      Dalia Heading, M.D.  Electronically Signed     MAJ/MEDQ  D:  03/27/2007  T:  03/27/2007  Job:  [id number]   cc:   Chart   Franchot Heidelberg, M.D.   Dalia Heading, M.D.  Fax: [phone]

## 2010-12-15 NOTE — Op Note (Signed)
NAMECHRISTION, Lawrence Reynolds NO.:  [id number]   MEDICAL RECORD NO.:  [id number]          PATIENT TYPE:  OBV   LOCATION:  A323                          FACILITY:  [facility]   PHYSICIAN:  Dalia Heading, M.D.  DATE OF BIRTH:  1940/10/06   DATE OF PROCEDURE:  03/24/2007  DATE OF DISCHARGE:                               OPERATIVE REPORT   PREOPERATIVE DIAGNOSIS:  Recurrent incisional hernia.   POSTOPERATIVE DIAGNOSIS:  Recurrent incisional hernia.   PROCEDURE:  Recurrent incisional herniorrhaphy with mesh.   SURGEON:  Dr. Franky Macho.   ANESTHESIA:  General endotracheal.   INDICATIONS:  The patient is a 70 year old white male status post  multiple abdominal surgeries in the past as well as incisional  herniorrhaphy with Marlex mesh in 2007 who now presents with recurrence  of the abdominal hernia.  Risks and benefits of procedure including  bleeding, infection, cardiopulmonary difficulties, and possibly  recurrence of the hernia were fully explained to the patient, gave  informed consent.   PROCEDURE NOTE:  The patient is placed in supine position.  After  induction of general endotracheal anesthesia, the abdomen was prepped  and draped in the usual sterile technique with Betadine.  Surgical site  confirmation was performed.   The previous midline surgical scar was excised.  The dissection was  taken down to the mesh and resultant hernia.  Appeared that the  previously placed mesh had separated from the left side of the rectus  abdominal wall.  The old mesh was removed.  There was some adhesions of  omentum and small bowel to the underside of the hernia defect.  These  were sharply dissected without difficulty.  The rectus muscle on either  side was identified.  A 15 x 20 cm piece of Proceed dual mesh was then  used and sized appropriately.  0 Prolene fixation sutures were then  placed circumferentially.  The subcutaneous layer was then  reapproximated using 2-0  Vicryl interrupted sutures.  The skin was  closed using staples.  Betadine ointment dry sterile dressings were  applied.  0.5 cm Sensorcaine was instilled in the surrounding wound.   All tape and needle counts correct end the procedure.  The patient was  extubated in the operating room went back to recovery room awake in  stable condition.   COMPLICATIONS:  None.   SPECIMEN:  None.   BLOOD LOSS:  Less than 100 mL.      Dalia Heading, M.D.  Electronically Signed     MAJ/MEDQ  D:  03/24/2007  T:  03/25/2007  Job:  [id number]   cc:   Franchot Heidelberg, M.D.

## 2010-12-15 NOTE — Assessment & Plan Note (Signed)
[facility]                       [place] CARDIOLOGY OFFICE NOTE   GERTRUDE, TARBET                    MRN:          [id number]  DATE:02/22/2007                            DOB:          08-14-1940    Mr. Weissinger is a 70 year old patient referred for evaluation of his  heart.  He has had some atypical chest pain in the last week.  He needs  preop clearance for ventral hernia repair and he has had some exertional  dyspnea.   In talking to the patient, he does not think he has a significant heart  problem.  He had some atypical chest pain back in 2002 and had a normal  Myoview.  He has not had a work up  since that time. He has been retired  for 3 years and is fairly sedentary. His weight has gone up quite a bit.  He initially had diverticulitis surgery 10 years ago.  He had 1 ventral  hernia repair and since December he is unfortunately had the recurrence  of a large ventral hernia.   In talking to the patient, his substernal chest pain is atypical, it has  been going on for a week.  It does sound musculoskeletal, it is relieved  with Tylenol, it is aggravated by twisting motions and sometimes can be  aggravated when he does work around the house.  It seems to be easing  off over the last 7 to 8 days.  There is no associated diaphoresis or  shortness of breath.   In regards to his exertional dyspnea, it is chronic.  I suspect that his  deconditioning is secondary to his obesity.  He stopped smoking back in  1988.  It has not been progressive.  He has had no increase in his  baseline mild lower extremity swelling.  There is no history of DVT or  PE.   The dyspnea has not changed with his musculoskeletal type chest pain.   In regards to the patient's operation, he has had previous  diverticulitis surgery, previous ventral hernia repair and 2 knee  replacements and has not had any anesthetic problems or periop  complications with any of  them.  There is no bleeding diathesis and no  significant allergies.   The patient's review of systems otherwise is negative.   MEDICATIONS:  Include:  1. Prozac 25 daily.  2. Flomax 0.4 daily.   NKDA   FAMILY HISTORY:  Remarkable for his father dying in a car wreck and his  mother dying of pneumonia.  He also has 1 brother who died of a car  wreck.   PAST SURGICAL HISTORY:  Includes previous diverticulitis surgery,  previous ventral hernia surgery, bilateral knee replacements.  The patient is retired.  He used to run a Presenter, broadcasting for [employer].  He enjoys growing vegetables at his garden at home, but is  otherwise fairly sedentary.  He quit smoking back in 1988 and does not  drink.   His exam is remarkable for an overweight white male in no distress.  Affect is appropriate.  His weight is 263 pounds.  Blood pressure is 110/72, pulse is 74 and  regular.  Respiratory rate is 15.  He is afebrile.  HEENT:  Normal.  Carotids without bruits.  There is no JVP elevation.  No  lymphadenopathy, no thyromegaly.  LUNGS:  Clear with good diaphragmatic motion.  No wheezing.  There is an S1, S2 with distant heart sounds.  PMI is normal.  ABDOMEN:  Protuberant.  He has a large ventral hernia and a previous  midline scar.  His hernia is reducible.  Bowel sounds are positive and  no AAA, no hepatosplenomegaly or hepatojugular reflux.  Distal pulses are intact.  He has trace edema.  He is status post  bilateral knee replacements.  NEURO:  Nonfocal.  There is no muscular weakness.   His EKG shows sinus rhythm with left axis deviation and poor R wave  progression.   The computer read it as question anterior wall infarction.   I reviewed recent lab work from February 18, 2007.  His white blood cell  count was 10.4, hemoglobin was 15.  Platelet count was 247.  BUN was 13,  creatinine was 0.98.   LFTs were normal.   IMPRESSION:  1. Mr. Marxen is probably fine for surgery.  His chest  pain is      atypical and improving.  I think all we would need to do is check a      2-D echocardiogram.  If his left ventricular function is normal, I      will clear him for surgery.  2. Dyspnea secondary to weight.  The patient certainly has a body      habitus that would lend itself to sleep apnea.  At some point in      the future it may be worthwhile to do a sleep study.  Again, doing      a 2-D echocardiogram will be helpful in terms of assessing right      ventricular and left ventricular function and ruling out pulmonary      hypertension.  3. Preop clearance in regards to his other risk factors.  His main      risk factor is his significant obesity; however, I do not think      that there are any other issues in regards to clearing him for      surgery so long as his echo shows normal left ventricular function.  4. Depression.  Continue Prozac 20 mg daily.  Seems to be in a good      mood and has not had any need for hospitalizations.  5. Prostatism on Flomax, having good relief with this.  Good urinary      stream.  Needs yearly PSA per Dr. Erby Pian.  6. Abnormal EKG.  The patient has left axis deviation, abnormal R wave      progression.  I suspect the abnormal R wave progression has to do      with his body size and lead placement.  However, the 2-D      echocardiogram again will help with this in regards to ruling out      any wall motion abnormalities.  7. The patient is 8, he is a nonsmoker, his chest pain is atypical.      I do not think he needs a      stress test to clear him for surgery.  Again, so long as his 2-D      echocardiogram is normal he will be cleared for ventral hernia  repair.     Noralyn Pick. Eden Emms, MD, [facility]  Electronically Signed    PCN/MedQ  DD: 02/22/2007  DT: 02/23/2007  Job #: [id number]   cc:   Franchot Heidelberg, M.D.  Dalia Heading, M.D.

## 2010-12-18 NOTE — Op Note (Signed)
NAMESAHAND, GOSCH NO.:  [id number]   MEDICAL RECORD NO.:  [id number]          PATIENT TYPE:  OBV   LOCATION:  A316                          FACILITY:  [facility]   PHYSICIAN:  Dalia Heading, M.D.  DATE OF BIRTH:  1941/03/21   DATE OF PROCEDURE:  07/22/2006  DATE OF DISCHARGE:  07/22/2006                               OPERATIVE REPORT   PREOPERATIVE DIAGNOSIS:  Incisional hernia.   POSTOPERATIVE DIAGNOSIS:  Incisional hernia.   PROCEDURE:  Incisional herniorrhaphy with mesh.   SURGEON:  Dalia Heading, M.D.   ANESTHESIA:  General endotracheal.   INDICATIONS:  The patient is a 70 year old of white male who presents  with an incisional hernia along the mid portion of a midline incision.  He is status post partial colectomy in the past.  The patient comes to  the operating room for an incisional herniorrhaphy with mesh.  The risks  and benefits of the procedure including bleeding, infection, pain, and  recurrence of the hernia were fully explained to the patient, who gave  informed consent.   PROCEDURE NOTE:  The patient was placed in the supine position.  After  induction of general endotracheal anesthesia, the abdomen was prepped  and draped in the usual sterile technique with Betadine.  Surgical site  confirmation was performed.   A midline incision was made through the previous scar from just above  the umbilicus to below the umbilicus.  The peritoneal cavity was entered  into without difficulty.  The patient was noted have a hernia defect  which was approximately 6 cm in length and 3 cm in its greatest  diameter.  Some underlying small bowel was adhesed to the abdominal wall  and this was freed away sharply without difficulty.  A 3 x 6 cm  polypropylene mesh patch was then placed in this region and secured  along the underside of the fascia using 0 Prolene interrupted sutures.  The subcutaneous layer was reapproximated using 2-0 Vicryl interrupted  sutures.  The skin was closed using staples.  0.5% Sensorcaine was  instilled in the surrounding wound.  Betadine ointment and a dry sterile  dressing was applied.   All tape and needle counts were correct at the end of the procedure.  The patient was extubated in the operating room and went back to the  recovery room awake in stable condition.   COMPLICATIONS:  None.   SPECIMEN:  None.   BLOOD LOSS:  Minimal.      Dalia Heading, M.D.  Electronically Signed     MAJ/MEDQ  D:  07/22/2006  T:  07/23/2006  Job:  [id number]

## 2010-12-18 NOTE — Procedures (Signed)
[name], Lawrence Reynolds NO.:  [id number]   MEDICAL RECORD NO.:  [id number]          PATIENT TYPE:  OUT   LOCATION:  SLEEP LAB                     FACILITY:  [facility]   PHYSICIAN:  Barbaraann Share, MD,FCCPDATE OF BIRTH:  1941/02/20   DATE OF STUDY:  07/02/2006                            NOCTURNAL POLYSOMNOGRAM   INDICATION FOR THE STUDY:  Hypersomnia with sleep apnea.   EPWORTH SCORE:  9.   SLEEP ARCHITECTURE:  The patient had total sleep time of only 174  minutes and never achieved slow wave sleep or REM.  Sleep onset latency  was prolonged at 79 minutes.  Sleep efficiency was very decreased at  43%.   RESPIRATORY DATA:  The patient was found to have 38 hypopneas and 24  apneas for a respiratory disturbance index of 21 events per hour.  The  events were not positional, and there was mild to moderate snoring noted  throughout.  The patient did not meet split night protocol secondary to  most of the events occurring much later in the night.  Therefore, there  was not adequate time for appropriate titration.   OXYGEN DATA:  There was O2 desaturation as low as 83% with the patient's  obstructive events.   CARDIAC DATA:  No clinically significant cardiac arrhythmias were noted.   MOVEMENT/PARASOMNIA:  The patient did have 48 leg jerks, but very few of  them resulted in arousal or awakening.   IMPRESSION/RECOMMENDATIONS:  1. Moderate obstructive sleep apnea/hypopnea syndrome with a      respiratory disturbance index of 21 events per hour and O2      desaturation as low as 83%.  The patient did not meet split night      protocol secondary to the majority of events occurring late at      night.  Treatment for this degree of sleep apnea can include weight      loss alone if applicable, upper airway surgery, oral appliance, and      also CPAP.  Clinical correlation is suggested.      Barbaraann Share, MD,FCCP  Diplomate, American Board of Sleep  Medicine     KMC/MEDQ  D:  07/19/2006 16:50:04  T:  07/20/2006 07:49:55  Job:  [id number]

## 2010-12-18 NOTE — H&P (Signed)
Lawrence Reynolds, Lawrence Reynolds NO.:  [id number]   MEDICAL RECORD NO.:  [id number]          PATIENT TYPE:  AMB   LOCATION:  DAY                           FACILITY:  [facility]   PHYSICIAN:  Dalia Heading, M.D.  DATE OF BIRTH:  [birth year]   DATE OF ADMISSION:  DATE OF DISCHARGE:  LH                              HISTORY & PHYSICAL   CHIEF COMPLAINT:  Recurrent incisional hernia.   HISTORY OF PRESENT ILLNESS:  Patient is a 70 year old white male who is  referred back for recurrence of his incisional hernia.  He has been  having worsening pain over the past few months.  He had an incisional  herniorrhaphy with mesh in December, 2007.  This was complicated by  postoperative balloon infection.  No nausea or vomiting had been noted.   PAST MEDICAL HISTORY:  As noted above.  Diverticulitis, chest pain,  which was recently worked up.   PAST SURGICAL HISTORY:  Cholecystectomy, partial colectomy, bilateral  knee replacement, incisional herniorrhaphy with mesh.   CURRENT MEDICATIONS:  Prozac, Flomax.   ALLERGIES:  No known drug allergies.   REVIEW OF SYSTEMS:  Patient underwent cardiac workup recently for  atypical chest pain.  He had a 2D echo and was found to be cleared for  surgery from a cardiac standpoint.   PHYSICAL EXAMINATION:  Patient is a well-developed and well-nourished  white male in no acute distress.  He is overweight for his size and age.  LUNGS:  Clear to auscultation with equal breath sounds bilaterally.  HEART:  Regular rate and rhythm without S3, S4, or murmurs.  ABDOMEN:  Soft, nontender, nondistended.  No hepatosplenomegaly or  masses are noted.  A reducible incisional hernia is noted along the  upper mid portion of the abdomen.   IMPRESSION:  Recurrent incisional hernia.   PLAN:  Patient is scheduled for a recurrent incisional herniorrhaphy  with mesh on March 24, 2007.  The risks and benefits of the procedure,  including bleeding, infection, pain,  recurrence of the hernia, were  fully explained to the patient, who gave informed consent.      Dalia Heading, M.D.  Electronically Signed     MAJ/MEDQ  D:  03/07/2007  T:  03/07/2007  Job:  [id number]   cc:   [name]  Fax: [phone]   Franchot Heidelberg, M.D.

## 2010-12-18 NOTE — H&P (Signed)
NAME:  Lawrence Reynolds, Lawrence Reynolds NO.:  [id number]   MEDICAL RECORD NO.:  [id number]          PATIENT TYPE:  INP   LOCATION:  A327                          FACILITY:  [facility]   PHYSICIAN:  Dalia Heading, M.D.  DATE OF BIRTH:  1941/01/07   DATE OF ADMISSION:  07/31/2006  DATE OF DISCHARGE:  LH                              HISTORY & PHYSICAL   CHIEF COMPLAINT:  Fever of unknown etiology.   HISTORY OF PRESENT ILLNESS:  The patient is a 70 year old white male  status post incisional herniorrhaphy with mesh on July 22, 2006 who  presented to the emergency room on August 01, 2006 with fever of  unknown etiology.  Chest x-ray is reportedly unremarkable. A CT scan of  the pelvis was performed which revealed subcutaneous fluid over the mesh  repair site along the ventral abdominal wall.  Urine cultures pending.  The patient started having fever and chills earlier in the evening.  No  nausea or vomiting have been noted.  There is a question whether he  started having diarrhea.   PAST MEDICAL HISTORY:  Unremarkable.   PAST SURGICAL HISTORY:  As noted above, cholecystectomy, partial  colectomy, bilateral knee replacements.   CURRENT MEDICATIONS:  Vicodin p.r.n. pain, Prozac 1 tablet p.o. daily.   ALLERGIES:  DARVOCET.   REVIEW OF SYSTEMS:  Noncontributory.   PHYSICAL EXAMINATION:  The patient is a well-developed, well-nourished  white male in no acute distress.  T-max 102, vital signs are pulse 90,  blood pressure 105/60.  LUNGS:  Clear to auscultation with equal breath sounds bilaterally.  HEART:  Reveals a regular rate and rhythm without S3, S4, or murmurs.  ABDOMEN:  Soft, nontender, nondistended.  A seroma was found after  opening one of the staples along the mid incision line.  No purulent  fluid was noted.  RECTAL:  Examination was deferred at this time.   White blood cell count 19.2, hematocrit 44, platelet count 272.  Met  seven is within normal limits.  Liver  enzyme tests are within normal  limits.  Multiple cultures are pending, lactic acid level is 3.1.  Urinalysis; rare bacteria, 0 to 2 red blood cells.   IMPRESSION:  Fever of unknown etiology.  Wound infection seems less  likely given that only a seroma was found.  No intra-abdominal abscess  was noted.  There is a question whether this may be viral syndrome.   PLAN:  The patient will be admitted to the hospital for intravenous  antibiotics and hydration.  Multiple cultures are pending.  Further  management is pending those results.      Dalia Heading, M.D.  Electronically Signed     MAJ/MEDQ  D:  08/01/2006  T:  08/01/2006  Job:  [id number]   cc:   Dalia Heading, M.D.  Fax: [phone]   Theophilus Bones, M.D.

## 2010-12-18 NOTE — Discharge Summary (Signed)
NAME:  Lawrence Reynolds, Lawrence Reynolds NO.:  [id number]   MEDICAL RECORD NO.:  [id number]          PATIENT TYPE:  INP   LOCATION:  A327                          FACILITY:  [facility]   PHYSICIAN:  Dalia Heading, M.D.  DATE OF BIRTH:  [birth year]   DATE OF ADMISSION:  07/31/2006  DATE OF DISCHARGE:  01/01/2008LH                               DISCHARGE SUMMARY   HOSPITAL COURSE SUMMARY:  The patient is a 70 year old white male status  post incisional herniorrhaphy with mesh approximately 10 days earlier,  who presented to the emergency room with fever of unknown etiology.  A  chest x-ray was negative.  Urinalysis was unremarkable.  White blood  cell count was noted be 19,000.  His wound grossly appeared normal.  CT  scan of the abdomen and pelvis revealed only a fluid collection in the  surgical site area.  He was admitted to the hospital for further  evaluation and treatment.  Blood cultures have been negative.  He has  defervesced nicely.  Inspection of the wound revealed only a seroma  without infection.  He was placed on Zosyn.  His white count has  decreased to 16,000.  He has had no fever since his admission.  It is  suspected that the patient had atypical bacteremia of unknown etiology.  As the patient is stable and improving, the patient is being discharged  home on hospital day #1 in good improving condition.   DISCHARGE INSTRUCTIONS:  The patient is to follow up Dr. Franky Macho on  08/04/2006.   DISCHARGE MEDICATIONS:  Include Zithromax Dosepak, use as directed.  He  is to resume his other medications as previously prescribed.   PRINCIPAL DIAGNOSES:  1. Fever of unknown etiology.  2. Status post incisional herniorrhaphy with mesh.   PRINCIPAL PROCEDURE:  None.      Dalia Heading, M.D.  Electronically Signed     MAJ/MEDQ  D:  08/02/2006  T:  08/02/2006  Job:  [id number]   cc:   Dalia Heading, M.D.  Fax: [phone]

## 2010-12-18 NOTE — H&P (Signed)
NAME:  Lawrence Reynolds, Lawrence Reynolds NO.:  [id number]   MEDICAL RECORD NO.:  [id number]          PATIENT TYPE:  AMB   LOCATION:  DAY                           FACILITY:  [facility]   PHYSICIAN:  Dalia Heading, M.D.  DATE OF BIRTH:  May 08, 1941   DATE OF ADMISSION:  DATE OF DISCHARGE:  LH                              HISTORY & PHYSICAL   CHIEF COMPLAINT:  Incisional hernia.   HISTORY OF PRESENT ILLNESS:  The patient is a 70 year old white male who  is referred for evaluation and treatment of an incisional hernia.  It  has been present for many months.  He had a partial colectomy in the  past by another physician.  No nausea or vomiting have been noted.  He  has had intermittent pain noted at the hernia site.   PAST MEDICAL HISTORY:  Is unremarkable.   PAST SURGICAL HISTORY:  1. Cholecystectomy.  2. Partial colectomy.  3. Bilateral knee replacements.   CURRENT MEDICATIONS:  None.   ALLERGIES:  No known drug allergies.   REVIEW OF SYSTEMS:  Noncontributory.   PHYSICAL EXAMINATION:  The patient is a well-developed, well-nourished  white male in no acute distress.  LUNGS:  Clear to auscultation with equal breath sounds bilaterally.  HEART:  Examination reveals a regular rate and rhythm without S3, S4, or  murmurs.  The abdomen is soft, nontender, nondistended.  No  hepatosplenomegaly or masses noted.  There is  reducible incisional  hernia noted around the umbilicus with laxity noted superiorly under the  incision line.   IMPRESSION:  Incisional hernia.   PLAN:  The patient is scheduled for incisional herniorrhaphy with mesh  on July 22, 2006.  The risks and benefits of the procedure including  bleeding, infection, pain, recurrence of the hernia were fully explained  to the patient, gave informed consent.      Dalia Heading, M.D.  Electronically Signed     MAJ/MEDQ  D:  07/19/2006  T:  07/19/2006  Job:  [id number]   cc:   Theophilus Bones, M.D.   Short-Stay  at [facility]

## 2010-12-18 NOTE — Op Note (Signed)
Lawrence Reynolds, Lawrence Reynolds             ACCOUNT NO.:  [id number]   MEDICAL RECORD NO.:  [id number]          PATIENT TYPE:  AMB   LOCATION:  DAY                           FACILITY:  [facility]   PHYSICIAN:  R. Roetta Sessions, M.D. DATE OF BIRTH:  09/22/1940   DATE OF PROCEDURE:  11/28/2006  DATE OF DISCHARGE:                               OPERATIVE REPORT   PROCEDURE:  Colonoscopy with biopsy.   INDICATIONS FOR PROCEDURE:  A 70 year old gentleman with a history  colonic adenomas.  He has had chronic migratory abdominal pain for  years; and it has been more localized in the right lower quadrant,  recently, status post appendectomy for acute appendicitis back in the  1990s, and a recent ventral hernia repair.  Colonoscopy is now being  done largely as a surveillance maneuver.  This approach has been  discussed with the patient at length.  The potential risks, benefits,  and alternatives have been reviewed, questions answered.  He is  agreeable.  Please see the documentation in the medical record.   PROCEDURE NOTE:  O2 saturation, blood pressure, pulse, and respirations  were monitored throughout the entire procedure.   CONSCIOUS SEDATION:  Fentanyl 50 mcg IV and Versed 3 mg IV in divided  doses.   INSTRUMENT:  Pentax video chip system.   FINDINGS:  A digital rectal exam revealed no abnormalities.  Endoscopic  findings, the prep was good.   COLON:  Colonic mucosa was surveyed from the rectosigmoid junction.  To  the left transverse and right colon to the appendiceal orifice,  ileocecal valve and cecum.  These structures were well seen and  photographed for the record.  The terminal ileum was intubated to 10 cm.  from this level the scope was slowly withdrawn.  All previous mucosal  surfaces were, again, seen.   The patient had left-sided diverticula.  There were two 3-mm polyps in  the base of the cecum which were cold biopsied.  There was a third  diminutive polyp at the splenic flexure  which was cold biopsied/removed.  The remainder of the colonic mucosa appeared normal aside from left-  sided diverticula.   The scope was pulled down to the rectum; and a thorough examination of  the rectal mucosa including a retroflex view of the anal verge  demonstrated a 2-mm polyp just inside the anal verge which was cold  biopsied/removed in the retroflex position.  The patient tolerated the  procedure well and was reacted in endoscopy.   IMPRESSION:  Diminutive rectal and colonic polyps as described above,  cold biopsied/removed, and left-sided diverticula.  The remainder of the  colonic mucosa and rectal mucosa, along with the terminal ileum mucosa  appeared normal.   RECOMMENDATIONS:  1. Followup on path.  2. Diverticulosis literature provided to [name].  Today's      findings do not explain the patient's abdominal pain.  3. Will proceed with an abdominal and pelvic CT with IV, oral contrast      to further evaluate his predominately right lower quadrant      abdominal pain.  4. Further recommendations to  follow.      Jonathon Bellows, M.D.  Electronically Signed     RMR/MEDQ  D:  11/28/2006  T:  11/28/2006  Job:  [id number]   cc:   Franchot Heidelberg, M.D.

## 2010-12-18 NOTE — Consult Note (Signed)
NAMEMAYLON, Lawrence Reynolds             ACCOUNT NO.:  [id number]   MEDICAL RECORD NO.:  [id number]          PATIENT TYPE:  AMB   LOCATION:                                FACILITY:  [facility]   PHYSICIAN:  R. Roetta Sessions, M.D. DATE OF BIRTH:  04/02/1941   DATE OF CONSULTATION:  11/22/2006  DATE OF DISCHARGE:                                 CONSULTATION   REFERRING PHYSICIAN:  Dr. Franchot Heidelberg   REASON FOR CONSULTATION:  Abdominal pain.   HISTORY OF PRESENT ILLNESS:  [name] is a pleasant, 70-year-  old, Caucasian male sent over courtesy of Dr. Erby Pian to further  evaluate right lower quadrant abdominal pain.  [name] has been  plagued with intermittent right lower quadrant abdominal pain going back  a good 15 years.  She was evaluated by me and others in the mid 1990s.  Just recently, he underwent a ventral hernia repair by Dr. Lovell Sheehan.  A  CT back in October demonstrated a couple of ventral hernias with small  amounts of fat.  He underwent a surgical repair.  It sounds like  postoperative he had some problems with a seroma versus abscess  formation in the area of hernia repair.  He was subsequently  rehospitalized but eventually got over that no problem.  He does have a  history of diverticulosis but not diverticulitis.  I saw this gentleman  back in the 1990s for abdominal pain.  Prior EGD demonstrated H. pylori  related gastritis for which he was treated in 1996.  He presented with  acute appendicitis and underwent surgery.  He subsequently underwent  removal of his gallbladder for cholecystitis.  In 1997, he was found to  have multiple mixed hyperplastic adenomatous polyps on colonoscopy.  Followup exam in 2001 demonstrated only diverticulosis.  He is 2 years  overdue for followup colonoscopy.  He has not had any change in bowel  habits, specifically denies melena, hematochezia, constipation,  diarrhea.  He has intermittent right lower quadrant abdominal pain  not  affected by eating or having a bowel movement.  It  comes and goes when  it wants to.  He also has concerns that the site of recent ventral  hernia repair has recurred with some bulging in the area on his  abdominal wall.  He has not had any odynophagia, dysphagia, early  satiety, reflux symptoms, nausea, or vomiting.  He has not lost any  weight.   PAST MEDICAL HISTORY:  Significant for history diverticulosis,  obstructive sleep apnea, anxiety, neurosis, obesity, osteoarthritis,  chronic low back pain, hyperlipidemia, BPH.   PAST SURGERIES:  Umbilical hernia repair previously, history of  diverticulosis.  He has had knee replacements x2.   CURRENT MEDICATIONS:  1. Flomax daily.  2. Prozac 30 gm daily.   ALLERGIES:  DEMEROL.   FAMILY HISTORY:  Brother had lung cancer.  Father's cause of death  unknown.  Mother died with pneumonia related complications.  No history  of chronic GI or liver illness otherwise.   SOCIAL HISTORY:  The patient has been married for 44 years, has 4  children.  He is retired from [employer].  Quit smoking  in 1987.  He formally consumed alcohol but none since 1987.   REVIEW OF SYSTEMS:  As in history of present illness.   PHYSICAL EXAMINATION:  GENERAL:  Pleasant, 70 year old gentleman resting  comfortably.  VITAL SIGNS:  Weight 264, height 5 feet 9, temp 97.4, BP 136/78, pulse  76.  SKIN:  Warm and dry.  There is no jaundice or any stigmata of chronic  liver disease.  HEENT:  No scleral icterus.  CHEST:  Lungs are clear to auscultation.  CARDIAC:  Regular rate and rhythm without murmur, gallop, or rub.  ABDOMEN:  Multiple surgical scars.  Laparotomy scar well healed.  He  does have a bulging midline with __________ abdominal wall muscles.  Looks like he has a recurrent ventral hernia.  He has a right lower  quadrant surgical scar well healed.  He has good bowel sounds.  The  abdomen is entirely soft and nontender without  appreciable mass or  organomegaly.  EXTREMITIES:  No edema.  RECTAL:  Good sphincter tone.  Scant brown stool in the rectal vault.  No mass.  Stool is Hemoccult negative.   IMPRESSION:  [name] is a pleasant, 70 year old gentleman with a  decade and a half history of somewhat migratory abdominal pain but pain  over time has been localized to the right lower quadrant.  He presented  with acute appendicitis back in the 1990s and underwent an appendectomy.  He more recently has had a ventral hernia repair.  He continues to have  right lower quadrant abdominal pain although he may have some issues  with recurrent ventral hernia formation.  It will be difficult for me to  indict the ventral hernia as a cause of his right lower quadrant  abdominal pain.  He does have a history of colonic adenomas and is  somewhat overdue for surveillance.  Clinically, he does not appear to  have diverticulitis nor did he have any evidence of diverticulitis on  his recent CT scans.   RECOMMENDATIONS:  We will proceed with colonoscopy for surveillance  purposes.  Potential risks, benefits, and alternatives have been  reviewed.  Depending on what is found on colonoscopy, I told [name] that we may well revisit his abdominal wall with a followup  abdominal CT in the near future.  Adhesive disease involving the right  lower quadrant is also not excluded at this time as a cause of right  lower quadrant abdominal pain.  I will make further recommendations in  the very near future.   I would like to thank Dr. Franchot Heidelberg for allowing me to see this  nice gentleman once again.      Jonathon Bellows, M.D.  Electronically Signed     RMR/MEDQ  D:  11/22/2006  T:  11/22/2006  Job:  [id number]   cc:   Franchot Heidelberg, M.D.

## 2010-12-18 NOTE — H&P (Signed)
NAMEREHAN, HOLNESS NO.:  [id number]   MEDICAL RECORD NO.:  [id number]          PATIENT TYPE:  AMB   LOCATION:                                FACILITY:  [facility]   PHYSICIAN:  Dalia Heading, M.D.  DATE OF BIRTH:  [birth year]   DATE OF ADMISSION:  DATE OF DISCHARGE:  LH                              HISTORY & PHYSICAL   CHIEF COMPLAINT:  Recurrent incisional hernia.   HISTORY OF PRESENT ILLNESS:  The patient is a 70 year old white male who  is referred back for a recurrence of his incisional hernia.  He has been  worsening over the past few months.  He last had an incisional  herniorrhaphy with mesh in December 2007.  He has gained weight since  that time.  No nausea or vomiting have been noted.   PAST MEDICAL HISTORY:  Diverticulitis.   PAST SURGICAL HISTORY:  Cholecystectomy, partial colectomy, bilateral  knee replacement, incisional herniorrhaphy with mesh in 2007.   CURRENT MEDICATIONS:  None.   ALLERGIES:  No known drug allergies.   REVIEW OF SYSTEMS:  Noncontributory.   PHYSICAL EXAMINATION:  GENERAL:  The patient is an overweight white male  in no acute distress.  LUNGS:  Clear to auscultation with equal breath sounds bilaterally.  HEART:  Reveals regular rate and rhythm without S3, S4 or murmurs.  ABDOMEN:  Soft, nontender, nondistended.  No hepatosplenomegaly or  masses are noted.  Reducible incisional hernia is noted in the upper mid  portion of the abdomen.   IMPRESSION:  Recurrent incisional hernia.   PLAN:  The patient is scheduled for recurrent incisional herniorrhaphy  with mesh on February 17, 2007.  The risks and benefits of the procedure  including bleeding, infection, pain, and recurrence of the hernia were  fully explained to the patient who gave informed consent.      Dalia Heading, M.D.  Electronically Signed     MAJ/MEDQ  D:  01/26/2007  T:  01/26/2007  Job:  [id number]   cc:   Franchot Heidelberg, M.D.

## 2011-05-14 LAB — BASIC METABOLIC PANEL
CO2: 30
Calcium: 8.8
Creatinine, Ser: 0.83
Glucose, Bld: 98
Sodium: 137

## 2011-05-14 LAB — CBC
Hemoglobin: 13.9
MCHC: 34.2
RDW: 13.2

## 2011-10-25 ENCOUNTER — Encounter: Payer: Self-pay | Admitting: Internal Medicine

## 2012-06-23 ENCOUNTER — Encounter (HOSPITAL_COMMUNITY): Payer: Self-pay | Admitting: *Deleted

## 2012-06-23 ENCOUNTER — Emergency Department (HOSPITAL_COMMUNITY): Payer: Medicare Other

## 2012-06-23 ENCOUNTER — Emergency Department (HOSPITAL_COMMUNITY)
Admission: EM | Admit: 2012-06-23 | Discharge: 2012-06-23 | Disposition: A | Discharge status: Home / Self Care | Payer: Medicare Other | Attending: Emergency Medicine | Admitting: Emergency Medicine

## 2012-06-23 DIAGNOSIS — Z79899 Other long term (current) drug therapy: Secondary | ICD-10-CM | POA: Insufficient documentation

## 2012-06-23 DIAGNOSIS — R109 Unspecified abdominal pain: Secondary | ICD-10-CM | POA: Insufficient documentation

## 2012-06-23 DIAGNOSIS — R1031 Right lower quadrant pain: Secondary | ICD-10-CM

## 2012-06-23 DIAGNOSIS — M549 Dorsalgia, unspecified: Secondary | ICD-10-CM | POA: Insufficient documentation

## 2012-06-23 DIAGNOSIS — Z7982 Long term (current) use of aspirin: Secondary | ICD-10-CM | POA: Insufficient documentation

## 2012-06-23 DIAGNOSIS — Z9660 Presence of unspecified orthopedic joint implant: Secondary | ICD-10-CM | POA: Insufficient documentation

## 2012-06-23 DIAGNOSIS — Z9089 Acquired absence of other organs: Secondary | ICD-10-CM | POA: Insufficient documentation

## 2012-06-23 LAB — BASIC METABOLIC PANEL
BUN: 23 mg/dL (ref 6–23)
CO2: 28 mEq/L (ref 19–32)
Calcium: 9 mg/dL (ref 8.4–10.5)
Chloride: 103 mEq/L (ref 96–112)
Creatinine, Ser: 0.88 mg/dL (ref 0.50–1.35)
Glucose, Bld: 80 mg/dL (ref 70–99)

## 2012-06-23 LAB — URINALYSIS, ROUTINE W REFLEX MICROSCOPIC
Bilirubin Urine: NEGATIVE
Glucose, UA: NEGATIVE mg/dL
Ketones, ur: NEGATIVE mg/dL
Leukocytes, UA: NEGATIVE
pH: 6 (ref 5.0–8.0)

## 2012-06-23 LAB — CBC WITH DIFFERENTIAL/PLATELET
Eosinophils Absolute: 0.4 10*3/uL (ref 0.0–0.7)
Lymphs Abs: 3.1 10*3/uL (ref 0.7–4.0)
MCH: 32 pg (ref 26.0–34.0)
Neutrophils Relative %: 53 % (ref 43–77)
Platelets: 227 10*3/uL (ref 150–400)
RBC: 4.4 MIL/uL (ref 4.22–5.81)
RDW: 13.4 % (ref 11.5–15.5)

## 2012-06-23 LAB — URINE MICROSCOPIC-ADD ON

## 2012-06-23 MED ORDER — HYDROCODONE-ACETAMINOPHEN 5-325 MG PO TABS: 1.0000 | ORAL_TABLET | Freq: Four times a day (QID) | ORAL | Status: AC | PRN

## 2012-06-23 MED ORDER — HYDROCODONE-ACETAMINOPHEN 5-325 MG PO TABS
1.0000 | ORAL_TABLET | ORAL | Status: AC
  Administered 2012-06-23: 1 via ORAL
  Filled 2012-06-23: qty 1

## 2012-06-23 NOTE — ED Notes (Signed)
MD at bedside. 

## 2012-06-23 NOTE — ED Notes (Signed)
Pain rt groin and back, x 1 week, No NVD,  Has "swelling" of rt groin.

## 2012-06-23 NOTE — ED Provider Notes (Signed)
History   This chart was scribed for Shelda Jakes, MD by Gerlean Ren, ED Scribe. This patient was seen in room APA18/APA18 and the patient's care was started at 7:42 PM    CSN: [id number]  Arrival date & time 06/23/12  1830   First MD Initiated Contact with Patient 06/23/12 1924      Chief Complaint  Patient presents with  . Abdominal Pain    The history is provided by the patient. No language interpreter was used.   Lawrence Reynolds is a 71 y.o. male who presents to the Emergency Department complaining of right groin pain that radiates to the back worsened by ambulation with gradual onset one week ago and no trauma, fall, or injury as cause.  Pt states pain is currently a 2-3/10.  Pt denies any h/o similar pain.  Pt denies abdominal pain, nausea, emesis, diarrhea, fever, chest pain, HA, dysuria, leg swelling, and rash.  Pt has no h/o chronic medical conditions.  Pt denies tobacco and alcohol use.    History reviewed. No pertinent past medical history.  Past Surgical History  Procedure Date  . Hernia repair   . Joint replacement   . Cholecystectomy   . Back surgery   . Total knee arthroplasty     History reviewed. No pertinent family history.  History  Substance Use Topics  . Smoking status: Never Smoker   . Smokeless tobacco: Not on file  . Alcohol Use: No      Review of Systems  Constitutional: Negative for fever.  Respiratory: Negative for shortness of breath.   Cardiovascular: Negative for chest pain.  Gastrointestinal: Negative for nausea, vomiting, abdominal pain and diarrhea.  Genitourinary: Negative for dysuria.  Musculoskeletal: Positive for back pain.       Right groin pain.  Skin: Negative for rash.  Neurological: Negative for headaches.  Psychiatric/Behavioral: Negative for confusion.    Allergies  Review of patient's allergies indicates no known allergies.  Home Medications   Current Outpatient Rx  Name  Route  Sig  Dispense  Refill  .  ASPIRIN EC 81 MG PO TBEC   Oral   Take 81 mg by mouth daily.         . ADULT MULTIVITAMIN W/MINERALS CH   Oral   Take 1 tablet by mouth daily.         Marland Kitchen PRIMIDONE 50 MG PO TABS   Oral   Take 50 mg by mouth 3 (three) times daily.         Marland Kitchen TAMSULOSIN HCL 0.4 MG PO CAPS   Oral   Take 0.4 mg by mouth every evening.          Marland Kitchen VITAMIN E PO   Oral   Take 1 tablet by mouth daily.         Marland Kitchen HYDROCODONE-ACETAMINOPHEN 5-325 MG PO TABS   Oral   Take 1-2 tablets by mouth every 6 (six) hours as needed for pain.   20 tablet   0     BP 156/73  Pulse 76  Temp 97.9 F (36.6 C) (Oral)  Resp 20  Ht 5\' 9"  (1.753 m)  Wt 255 lb (115.667 kg)  BMI 37.66 kg/m2  SpO2 98%  Physical Exam  Nursing note and vitals reviewed. Constitutional: He is oriented to person, place, and time. He appears well-developed and well-nourished.  HENT:  Head: Normocephalic and atraumatic.  Eyes: Conjunctivae normal and EOM are normal.  Neck: Normal range of motion. No  tracheal deviation present.  Cardiovascular: Normal rate, regular rhythm and normal heart sounds.   No murmur heard. Pulmonary/Chest: Effort normal and breath sounds normal. He has no wheezes.  Abdominal: Soft. Bowel sounds are normal. There is no tenderness.  Genitourinary:       Both testicles non-tender.  Phallus normal.  Epididymis non-tender.  No groin hernia noted.  Musculoskeletal: Normal range of motion. He exhibits no edema.  Neurological: He is alert and oriented to person, place, and time. No cranial nerve deficit.  Skin: Skin is warm.  Psychiatric: He has a normal mood and affect.    ED Course  Procedures (including critical care time) DIAGNOSTIC STUDIES: Oxygen Saturation is 98% on room air, normal by my interpretation.    COORDINATION OF CARE: 7:49 PM- Patient informed of clinical course, understands medical decision-making process, and agrees with plan.  Ordered CBC, b-met, urinalysis, and abdominal pelvic CT w/o  contrast. 10:02 PM- Informed pt of negative labs and CT scan.  Advised follow-up with PCP and to return if symptoms worsen.   Labs Reviewed  CBC WITH DIFFERENTIAL - Abnormal; Notable for the following:    Monocytes Absolute 1.1 (*)     All other components within normal limits  BASIC METABOLIC PANEL - Abnormal; Notable for the following:    GFR calc non Af Amer 85 (*)     All other components within normal limits  URINALYSIS, ROUTINE W REFLEX MICROSCOPIC - Abnormal; Notable for the following:    Hgb urine dipstick TRACE (*)     All other components within normal limits  URINE MICROSCOPIC-ADD ON   Results for orders placed during the hospital encounter of 06/23/12  CBC WITH DIFFERENTIAL      Component Value Range   WBC 9.7  4.0 - 10.5 K/uL   RBC 4.40  4.22 - 5.81 MIL/uL   Hemoglobin 14.1  13.0 - 17.0 g/dL   HCT 96.0  45.4 - 09.8 %   MCV 94.3  78.0 - 100.0 fL   MCH 32.0  26.0 - 34.0 pg   MCHC 34.0  30.0 - 36.0 g/dL   RDW 11.9  14.7 - 82.9 %   Platelets 227  150 - 400 K/uL   Neutrophils Relative 53  43 - 77 %   Neutro Abs 5.2  1.7 - 7.7 K/uL   Lymphocytes Relative 32  12 - 46 %   Lymphs Abs 3.1  0.7 - 4.0 K/uL   Monocytes Relative 11  3 - 12 %   Monocytes Absolute 1.1 (*) 0.1 - 1.0 K/uL   Eosinophils Relative 4  0 - 5 %   Eosinophils Absolute 0.4  0.0 - 0.7 K/uL   Basophils Relative 0  0 - 1 %   Basophils Absolute 0.0  0.0 - 0.1 K/uL  BASIC METABOLIC PANEL      Component Value Range   Sodium 141  135 - 145 mEq/L   Potassium 3.6  3.5 - 5.1 mEq/L   Chloride 103  96 - 112 mEq/L   CO2 28  19 - 32 mEq/L   Glucose, Bld 80  70 - 99 mg/dL   BUN 23  6 - 23 mg/dL   Creatinine, Ser 5.62  0.50 - 1.35 mg/dL   Calcium 9.0  8.4 - 13.0 mg/dL   GFR calc non Af Amer 85 (*) >90 mL/min   GFR calc Af Amer >90  >90 mL/min  URINALYSIS, ROUTINE W REFLEX MICROSCOPIC      Component Value  Range   Color, Urine YELLOW  YELLOW   APPearance CLEAR  CLEAR   Specific Gravity, Urine 1.025  1.005 -  1.030   pH 6.0  5.0 - 8.0   Glucose, UA NEGATIVE  NEGATIVE mg/dL   Hgb urine dipstick TRACE (*) NEGATIVE   Bilirubin Urine NEGATIVE  NEGATIVE   Ketones, ur NEGATIVE  NEGATIVE mg/dL   Protein, ur NEGATIVE  NEGATIVE mg/dL   Urobilinogen, UA 0.2  0.0 - 1.0 mg/dL   Nitrite NEGATIVE  NEGATIVE   Leukocytes, UA NEGATIVE  NEGATIVE  URINE MICROSCOPIC-ADD ON      Component Value Range   Squamous Epithelial / LPF RARE  RARE   RBC / HPF 0-2  <3 RBC/hpf   Bacteria, UA RARE  RARE    Ct Abdomen Pelvis Wo Contrast  06/23/2012  *RADIOLOGY REPORT*  Clinical Data: Right groin and and back pain.  Previous cholecystectomy.  CT ABDOMEN AND PELVIS WITHOUT CONTRAST  Technique:  Multidetector CT imaging of the abdomen and pelvis was performed following the standard protocol without intravenous contrast.  Comparison: 08/25/2009  Findings: Minimal dependent atelectasis in the visualized lung bases.  Linear scarring medially in the right middle lobe with regional bronchiectasis as before.  Vascular clips in the gallbladder fossa.  Unremarkable uninfused evaluation of the liver, spleen, adrenal glands.  Mild pancreatic parenchymal atrophy without focal lesion.  Kidneys unremarkable.  No hydronephrosis or nephrolithiasis.  The ureters are decompressed without calculus.  Urinary bladder is incompletely distended, unremarkable.  Stomach is physiologically distended.  Small bowel decompressed. Appendix not seen.  Vascular clip near the base of the cecum. There are scattered colonic diverticula without adjacent inflammatory/edematous change.  Mild prostatic enlargement with central coarse calcification.  No ascites.  Vascular clips in the left pelvis as before.  No free air.  Sub centimeter nodes in the porta hepatis.  No new adenopathy.  Patchy aortoiliac arterial calcifications.  Mild spurring in the lower thoracic and lumbar spine.  IMPRESSION: 1.  Negative for acute abdominal process. 2. Scattered colonic diverticula. 3.   Stable postoperative and degenerative changes as above.   Original Report Authenticated By: D. Andria Rhein, MD      1. Right groin pain       MDM  followup with your regular Dr. If not improved in a few days. Today's workup without evidence of any significant findings. CT of the abdomen without evidence of hernia or acute abdominal process. Urinalysis is negative epididymis was nontender no evidence of prostate infection urinary tract infection or epididymitis. No evidence of groin hernia.      I personally performed the services described in this documentation, which was scribed in my presence. The recorded information has been reviewed and is accurate.    Shelda Jakes, MD 06/23/12 [phone]

## 2012-06-23 NOTE — ED Notes (Signed)
Patient transported to CT 

## 2012-07-01 ENCOUNTER — Encounter (HOSPITAL_COMMUNITY): Payer: Self-pay | Admitting: *Deleted

## 2012-07-01 ENCOUNTER — Emergency Department (HOSPITAL_COMMUNITY)
Admission: EM | Admit: 2012-07-01 | Discharge: 2012-07-01 | Discharge status: Against Medical Advice | Payer: Medicare Other | Attending: Emergency Medicine | Admitting: Emergency Medicine

## 2012-07-01 DIAGNOSIS — R110 Nausea: Secondary | ICD-10-CM | POA: Insufficient documentation

## 2012-07-01 DIAGNOSIS — R42 Dizziness and giddiness: Secondary | ICD-10-CM | POA: Insufficient documentation

## 2012-07-01 NOTE — ED Notes (Signed)
Pt c/o dizziness and nausea just PTA. Denies any chest pain.

## 2012-07-01 NOTE — ED Notes (Signed)
Were told that pt left

## 2013-04-03 ENCOUNTER — Observation Stay (HOSPITAL_COMMUNITY)
Admission: EM | Admit: 2013-04-03 | Discharge: 2013-04-06 | Disposition: A | Discharge status: Home / Self Care | Payer: Medicare Other | Attending: Internal Medicine | Admitting: Internal Medicine

## 2013-04-03 ENCOUNTER — Encounter (HOSPITAL_COMMUNITY): Payer: Self-pay | Admitting: *Deleted

## 2013-04-03 ENCOUNTER — Emergency Department (HOSPITAL_COMMUNITY): Payer: Medicare Other

## 2013-04-03 DIAGNOSIS — K439 Ventral hernia without obstruction or gangrene: Secondary | ICD-10-CM

## 2013-04-03 DIAGNOSIS — F411 Generalized anxiety disorder: Secondary | ICD-10-CM

## 2013-04-03 DIAGNOSIS — M1710 Unilateral primary osteoarthritis, unspecified knee: Secondary | ICD-10-CM

## 2013-04-03 DIAGNOSIS — M545 Low back pain, unspecified: Secondary | ICD-10-CM

## 2013-04-03 DIAGNOSIS — E669 Obesity, unspecified: Secondary | ICD-10-CM

## 2013-04-03 DIAGNOSIS — G4730 Sleep apnea, unspecified: Secondary | ICD-10-CM | POA: Insufficient documentation

## 2013-04-03 DIAGNOSIS — E291 Testicular hypofunction: Secondary | ICD-10-CM

## 2013-04-03 DIAGNOSIS — K5900 Constipation, unspecified: Secondary | ICD-10-CM

## 2013-04-03 DIAGNOSIS — N400 Enlarged prostate without lower urinary tract symptoms: Secondary | ICD-10-CM | POA: Insufficient documentation

## 2013-04-03 DIAGNOSIS — K219 Gastro-esophageal reflux disease without esophagitis: Secondary | ICD-10-CM

## 2013-04-03 DIAGNOSIS — G4733 Obstructive sleep apnea (adult) (pediatric): Secondary | ICD-10-CM

## 2013-04-03 DIAGNOSIS — E785 Hyperlipidemia, unspecified: Secondary | ICD-10-CM

## 2013-04-03 DIAGNOSIS — R51 Headache: Secondary | ICD-10-CM

## 2013-04-03 DIAGNOSIS — R030 Elevated blood-pressure reading, without diagnosis of hypertension: Secondary | ICD-10-CM

## 2013-04-03 DIAGNOSIS — M1990 Unspecified osteoarthritis, unspecified site: Secondary | ICD-10-CM

## 2013-04-03 DIAGNOSIS — Z96659 Presence of unspecified artificial knee joint: Secondary | ICD-10-CM | POA: Insufficient documentation

## 2013-04-03 DIAGNOSIS — X58XXXA Exposure to other specified factors, initial encounter: Secondary | ICD-10-CM | POA: Insufficient documentation

## 2013-04-03 DIAGNOSIS — R079 Chest pain, unspecified: Secondary | ICD-10-CM | POA: Insufficient documentation

## 2013-04-03 DIAGNOSIS — F528 Other sexual dysfunction not due to a substance or known physiological condition: Secondary | ICD-10-CM

## 2013-04-03 DIAGNOSIS — Z8719 Personal history of other diseases of the digestive system: Secondary | ICD-10-CM

## 2013-04-03 DIAGNOSIS — K429 Umbilical hernia without obstruction or gangrene: Secondary | ICD-10-CM

## 2013-04-03 DIAGNOSIS — IMO0002 Reserved for concepts with insufficient information to code with codable children: Secondary | ICD-10-CM

## 2013-04-03 DIAGNOSIS — T783XXA Angioneurotic edema, initial encounter: Principal | ICD-10-CM | POA: Insufficient documentation

## 2013-04-03 DIAGNOSIS — N401 Enlarged prostate with lower urinary tract symptoms: Secondary | ICD-10-CM

## 2013-04-03 DIAGNOSIS — G250 Essential tremor: Secondary | ICD-10-CM

## 2013-04-03 DIAGNOSIS — R109 Unspecified abdominal pain: Secondary | ICD-10-CM

## 2013-04-03 LAB — CBC WITH DIFFERENTIAL/PLATELET
Eosinophils Relative: 3 % (ref 0–5)
Hemoglobin: 14.9 g/dL (ref 13.0–17.0)
Lymphocytes Relative: 34 % (ref 12–46)
Lymphs Abs: 4.9 10*3/uL — ABNORMAL HIGH (ref 0.7–4.0)
MCV: 95.7 fL (ref 78.0–100.0)
Monocytes Relative: 9 % (ref 3–12)
Platelets: 236 10*3/uL (ref 150–400)
RBC: 4.67 MIL/uL (ref 4.22–5.81)
WBC: 14.4 10*3/uL — ABNORMAL HIGH (ref 4.0–10.5)

## 2013-04-03 LAB — TROPONIN I
Troponin I: <0.3 ng/mL (ref <0.30)
Troponin I: <0.3 ng/mL (ref <0.30)

## 2013-04-03 LAB — BASIC METABOLIC PANEL
CO2: 29 mEq/L (ref 19–32)
Calcium: 9.2 mg/dL (ref 8.4–10.5)
Chloride: 98 mEq/L (ref 96–112)
Potassium: 3.6 mEq/L (ref 3.5–5.1)
Sodium: 137 mEq/L (ref 135–145)

## 2013-04-03 LAB — MRSA PCR SCREENING: MRSA by PCR: NEGATIVE

## 2013-04-03 MED ORDER — DIPHENHYDRAMINE HCL 50 MG/ML IJ SOLN
25.0000 mg | Freq: Once | INTRAMUSCULAR | Status: AC
  Administered 2013-04-03: 25 mg via INTRAVENOUS
  Filled 2013-04-03: qty 1

## 2013-04-03 MED ORDER — SODIUM CHLORIDE 0.9 % IV SOLN
INTRAVENOUS | Status: DC
  Administered 2013-04-03 – 2013-04-05 (×4): via INTRAVENOUS

## 2013-04-03 MED ORDER — FAMOTIDINE IN NACL 20-0.9 MG/50ML-% IV SOLN
20.0000 mg | Freq: Once | INTRAVENOUS | Status: AC
  Administered 2013-04-03: 20 mg via INTRAVENOUS
  Filled 2013-04-03 (×2): qty 50

## 2013-04-03 MED ORDER — METHYLPREDNISOLONE SODIUM SUCC 125 MG IJ SOLR
60.0000 mg | Freq: Two times a day (BID) | INTRAMUSCULAR | Status: DC
  Administered 2013-04-04: 60 mg via INTRAVENOUS
  Filled 2013-04-03: qty 2

## 2013-04-03 MED ORDER — DIPHENHYDRAMINE HCL 50 MG/ML IJ SOLN
25.0000 mg | Freq: Four times a day (QID) | INTRAMUSCULAR | Status: DC
  Administered 2013-04-03 – 2013-04-04 (×2): 25 mg via INTRAMUSCULAR
  Filled 2013-04-03 (×2): qty 1

## 2013-04-03 MED ORDER — METHYLPREDNISOLONE SODIUM SUCC 125 MG IJ SOLR
125.0000 mg | Freq: Once | INTRAMUSCULAR | Status: AC
  Administered 2013-04-03: 125 mg via INTRAVENOUS
  Filled 2013-04-03: qty 2

## 2013-04-03 MED ORDER — EPINEPHRINE 0.3 MG/0.3ML IJ SOAJ
0.3000 mg | Freq: Once | INTRAMUSCULAR | Status: AC
  Administered 2013-04-03: 0.3 mg via INTRAMUSCULAR
  Filled 2013-04-03: qty 0.3

## 2013-04-03 MED ORDER — ENOXAPARIN SODIUM 60 MG/0.6ML ~~LOC~~ SOLN
60.0000 mg | SUBCUTANEOUS | Status: DC
  Administered 2013-04-03 – 2013-04-05 (×3): 60 mg via SUBCUTANEOUS
  Filled 2013-04-03 (×3): qty 0.6

## 2013-04-03 MED ORDER — MORPHINE SULFATE 2 MG/ML IJ SOLN
1.0000 mg | INTRAMUSCULAR | Status: DC | PRN
  Administered 2013-04-04: 1 mg via INTRAVENOUS
  Filled 2013-04-03: qty 1

## 2013-04-03 NOTE — ED Notes (Signed)
Pt states cant tell any different in lip at this time. Bottom lip still notably swollen. No difficulty breathing or trouble swallowing.

## 2013-04-03 NOTE — ED Notes (Signed)
Reports cp and angioedema starting this morning.  Denies sob/difficulty swallowing.

## 2013-04-03 NOTE — H&P (Signed)
PCP:   Kirstie Peri, MD   Chief Complaint:  Lip swelling   HPI: 72 year old male with history of a to sleep apnea, BPH came to the hospital after he developed lip swelling. As per patient's wife they had breakfast at [facility], and patient started developing upper lip swelling. He came back and developed some headache, patient took [facility] powder and slept, when he woke up he had large lower lip swelling. Patient did not have tongue swelling. Denies shortness of breath, no difficulty swallowing. In the ED patient was given epinephrine injection, Solu-Medrol and Benadryl. Patient also complained of chest pain which developed when he was coming to the ED, last about 2 hours. Pain was done with 10 in intensity and it has resolved at this time. Facet of cardiac enzymes is negative EKG does not show significant changes. Patient denies shortness of breath. As per family, patient had a similar episode in the past when before surgery patient's mouth was swabbed and after that he developed lip swelling which was so severe it involved his tongue and required intubation.  Allergies:  No Known Allergies   History reviewed. No pertinent past medical history.  Past Surgical History  Procedure Laterality Date  . Hernia repair    . Joint replacement    . Cholecystectomy    . Back surgery    . Total knee arthroplasty      Prior to Admission medications   Medication Sig Start Date End Date Taking? Authorizing Provider  aspirin EC 81 MG tablet Take 81 mg by mouth daily.    Historical Provider, MD  Multiple Vitamin (MULTIVITAMIN WITH MINERALS) TABS Take 1 tablet by mouth daily.    Historical Provider, MD  primidone (MYSOLINE) 50 MG tablet Take 100 mg by mouth 2 (two) times daily.     Historical Provider, MD  sulfamethoxazole-trimethoprim (BACTRIM DS) 800-160 MG per tablet Take 1 tablet by mouth 2 (two) times daily. 03/16/13   Historical Provider, MD  Tamsulosin HCl (FLOMAX) 0.4 MG CAPS Take 0.4 mg by mouth  every evening.     Historical Provider, MD  triamcinolone cream (KENALOG) 0.1 % Apply 1 application topically daily as needed. 03/07/13   Historical Provider, MD  VITAMIN E PO Take 1 tablet by mouth daily.    Historical Provider, MD    Social History:  reports that he has never smoked. He does not have any smokeless tobacco history on file. He reports that he does not drink alcohol or use illicit drugs.   family history: Noncontributory  All the positives are listed in BOLD  Review of Systems:  HEENT: Headache, blurred vision, runny nose, sore throat Neck: Hypothyroidism, hyperthyroidism,,lymphadenopathy Chest : Shortness of breath, history of COPD, Asthma Heart : Chest pain, history of coronary arterey disease GI:  Nausea, vomiting, diarrhea, constipation, GERD GU: Dysuria, urgency, frequency of urination, hematuria, BPH  Neuro: Stroke, seizures, syncope Psych: Depression, anxiety, hallucinations   Physical Exam: Blood pressure 113/53, pulse 74, temperature 98.1 F (36.7 C), temperature source Oral, resp. rate 20, SpO2 96.00%. Constitutional:   Patient is a well-developed and well-nourished male in no acute distress and cooperative with exam. Head: Normocephalic and atraumatic Mouth: Mucus membranes moist, lower lip is swollen , tongue  is normal  Eyes: PERRL, EOMI, conjunctivae normal Neck: Supple, No Thyromegaly Cardiovascular: RRR, S1 normal, S2 normal Pulmonary/Chest: CTAB, no wheezes, rales, or rhonchi Abdominal: Soft. Non-tender, non-distended, bowel sounds are normal, no masses, organomegaly, or guarding present.  Neurological: A&O x3, Strenght is normal  and symmetric bilaterally, cranial nerve II-XII are grossly intact, no focal motor deficit, sensory intact to light touch bilaterally.  Extremities : No Cyanosis, Clubbing or Edema   Labs on Admission:  Results for orders placed during the hospital encounter of 04/03/13 (from the past 48 hour(s))  BASIC METABOLIC PANEL      Status: Abnormal   Collection Time    04/03/13  6:36 PM      Result Value Range   Sodium 137  135 - 145 mEq/L   Potassium 3.6  3.5 - 5.1 mEq/L   Chloride 98  96 - 112 mEq/L   CO2 29  19 - 32 mEq/L   Glucose, Bld 92  70 - 99 mg/dL   BUN 20  6 - 23 mg/dL   Creatinine, Ser 8.29  0.50 - 1.35 mg/dL   Calcium 9.2  8.4 - 56.2 mg/dL   GFR calc non Af Amer 89 (*) >90 mL/min   GFR calc Af Amer >90  >90 mL/min   Comment: (NOTE)     The eGFR has been calculated using the CKD EPI equation.     This calculation has not been validated in all clinical situations.     eGFR's persistently <90 mL/min signify possible Chronic Kidney     Disease.  CBC WITH DIFFERENTIAL     Status: Abnormal   Collection Time    04/03/13  6:36 PM      Result Value Range   WBC 14.4 (*) 4.0 - 10.5 K/uL   RBC 4.67  4.22 - 5.81 MIL/uL   Hemoglobin 14.9  13.0 - 17.0 g/dL   HCT 13.0  86.5 - 78.4 %   MCV 95.7  78.0 - 100.0 fL   MCH 31.9  26.0 - 34.0 pg   MCHC 33.3  30.0 - 36.0 g/dL   RDW 69.6  29.5 - 28.4 %   Platelets 236  150 - 400 K/uL   Neutrophils Relative % 54  43 - 77 %   Neutro Abs 7.8 (*) 1.7 - 7.7 K/uL   Lymphocytes Relative 34  12 - 46 %   Lymphs Abs 4.9 (*) 0.7 - 4.0 K/uL   Monocytes Relative 9  3 - 12 %   Monocytes Absolute 1.3 (*) 0.1 - 1.0 K/uL   Eosinophils Relative 3  0 - 5 %   Eosinophils Absolute 0.4  0.0 - 0.7 K/uL   Basophils Relative 0  0 - 1 %   Basophils Absolute 0.0  0.0 - 0.1 K/uL  TROPONIN I     Status: None   Collection Time    04/03/13  6:36 PM      Result Value Range   Troponin I <0.30  <0.30 ng/mL   Comment:            Due to the release kinetics of cTnI,     a negative result within the first hours     of the onset of symptoms does not rule out     myocardial infarction with certainty.     If myocardial infarction is still suspected,     repeat the test at appropriate intervals.    Radiological Exams on Admission: Dg Chest Portable 1 View  04/03/2013   *RADIOLOGY REPORT*   Clinical Data: Chest pain and lower lip swelling.  PORTABLE CHEST - 1 VIEW  Comparison: 04/19/2012 from [facility]  Findings: Apical lordotic positioning. Midline trachea. Cardiomegaly accentuated by AP portable technique.  No pleural effusion or  pneumothorax.  No congestive failure.  Clear lungs.  IMPRESSION: Cardiomegaly without congestive failure.   Original Report Authenticated By: Jeronimo Greaves, M.D.    Assessment/Plan Active Problems:   Angioedema Chest pain   Will admit the patient in step down Start Solu-Medrol 60 mg every 12 hours Benadryl 25 mg IV every 6 hours We'll keep patient n.p.o.  Chest pain We'll cycle cardiac enzymes  DVT prophylaxis Lovenox  Code status: presumed full code  Family discussion: discussed with patient's wife at bedside   Time Spent on Admission: 60 min  [facility] Pager: [phone] 04/03/2013, 8:18 PM  If 7PM-7AM, please contact night-coverage  [email or web address]  Password [email or web address]

## 2013-04-03 NOTE — ED Provider Notes (Signed)
CSN: [id number]     Arrival date & time 04/03/13  1744 History  This chart was scribed for Joya Gaskins, MD by Bennett Scrape, ED Scribe and Valera Castle, ED Scribe. This patient was seen in room APA02/APA02 and the patient's care was started at 6:10 PM.    Chief Complaint  Patient presents with  . Angioedema    Patient is a 72 y.o. male presenting with allergic reaction. The history is provided by the patient. No language interpreter was used.  Allergic Reaction Presenting symptoms: rash and swelling   Presenting symptoms: no difficulty swallowing   Swelling:    Location:  Face   Timing:  Constant   Progression:  Worsening   Chronicity:  New Severity:  Moderate Prior allergic episodes:  No prior episodes Context comment:  Unknown Relieved by:  None tried Ineffective treatments:  None tried   HPI Comments: Lawrence Reynolds is a 72 y.o. male who presents to the Emergency Department complaining of angioedema of the lower lip that was noticed upon waking this morning and has worsened throughout the day. He reports an associated rash described as itching to his arms and inguinal region. He denies tongue involvement and denies difficulty swallowing and SOB. He denies any known insect bites or new medications. He denies any other known triggers and denies any prior episodes of the same.  He also reports a secondary complaint of right sided CP that has been felt intermittently for the past 2 to 3 hours. He denies having any active CP currently. He describes the pain as fluctuating sharp and pressure-like but is unwilling to provide other details.   PMH - BPH Past Surgical History  Procedure Laterality Date  . Hernia repair    . Joint replacement    . Cholecystectomy    . Back surgery    . Total knee arthroplasty     No family history on file. History  Substance Use Topics  . Smoking status: Never Smoker   . Smokeless tobacco: Not on file  . Alcohol Use: No    Review of  Systems  HENT: Positive for facial swelling. Negative for trouble swallowing and voice change.   Respiratory: Negative for shortness of breath.   Cardiovascular: Positive for chest pain (currently resolved).  Skin: Positive for rash.  Neurological: Negative for syncope.  All other systems reviewed and are negative.    Allergies  Review of patient's allergies indicates no known allergies.  Home Medications   Current Outpatient Rx  Name  Route  Sig  Dispense  Refill  . aspirin EC 81 MG tablet   Oral   Take 81 mg by mouth daily.         . Multiple Vitamin (MULTIVITAMIN WITH MINERALS) TABS   Oral   Take 1 tablet by mouth daily.         . primidone (MYSOLINE) 50 MG tablet   Oral   Take 50 mg by mouth 3 (three) times daily.         . Tamsulosin HCl (FLOMAX) 0.4 MG CAPS   Oral   Take 0.4 mg by mouth every evening.          Marland Kitchen VITAMIN E PO   Oral   Take 1 tablet by mouth daily.          BP 131/76  Resp 20  SpO2 95%  Physical Exam  Nursing note and vitals reviewed.  CONSTITUTIONAL: Well developed/well nourished HEAD: angioedema of the lower lip,  mild tongue swelling noted, no drooling, no stridor EYES: EOMI/PERRL ENMT: Mucous membranes moist.  Uvula midline.  No edema of posterior oropharynx NECK: supple no meningeal signs SPINE:entire spine nontender CV: S1/S2 noted, no murmurs/rubs/gallops noted LUNGS: Lungs are clear to auscultation bilaterally, no apparent distress ABDOMEN: soft, nontender, no rebound or guarding NEURO: Pt is awake/alert, moves all extremitiesx4 EXTREMITIES: pulses normal, full ROM SKIN: warm, color normal, scattered rash noted to back PSYCH: no abnormalities of mood noted  ED Course  Procedures  CRITICAL CARE Performed by: Joya Gaskins Total critical care time: 37 Critical care time was exclusive of separately billable procedures and treating other patients. Critical care was necessary to treat or prevent imminent or  life-threatening deterioration. Critical care was time spent personally by me on the following activities: development of treatment plan with patient and/or surrogate as well as nursing, discussions with consultants, evaluation of patient's response to treatment, examination of patient, obtaining history from patient or surrogate, ordering and performing treatments and interventions, ordering and review of laboratory studies, ordering and review of radiographic studies, pulse oximetry and re-evaluation of patient's condition.  Medications  EPINEPHrine (EPI-PEN) injection 0.3 mg (not administered)  diphenhydrAMINE (BENADRYL) injection 25 mg (not administered)  methylPREDNISolone sodium succinate (SOLU-MEDROL) 125 mg/2 mL injection 125 mg (not administered)  famotidine (PEPCID) IVPB 20 mg (not administered)    DIAGNOSTIC STUDIES: Oxygen Saturation is 95% on room air, normal by my interpretation.    COORDINATION OF CARE: 6:15 PM-Discussed treatment plan which includes epi pen with pt at bedside and pt agreed to plan. Advised pt that admission for overnight admission is probable and he agreed.  Pt with likely allergic rxn.  Due to angioedema, I feel he needs epinephrine despite recent chest pain (no active CP right now, EKG unremarkable) Pt stable and protecting his airway 7:11 PM Pt stable, without any worsening, labs pending 7:51 PM Will admit D/w dr Sharl Ma, will place on stepdown.  No signs of airway compromise at this time Pt stabilized, given epinephrine to prevent any worsening of his symptoms Check on him frequently while in the ED  Labs Review Labs Reviewed  BASIC METABOLIC PANEL  CBC WITH DIFFERENTIAL  TROPONIN I     MDM   Nursing notes including past medical history and social history reviewed and considered in documentation xrays reviewed and considered Labs/vital reviewed and considered       Date: 04/03/2013  Rate: 73  Rhythm: normal sinus rhythm  QRS Axis:  normal  Intervals: normal  ST/T Wave abnormalities: nonspecific ST changes  Conduction Disutrbances:none     I personally performed the services described in this documentation, which was scribed in my presence. The recorded information has been reviewed and is accurate.      Joya Gaskins, MD 04/03/13 [phone]

## 2013-04-04 DIAGNOSIS — G4733 Obstructive sleep apnea (adult) (pediatric): Secondary | ICD-10-CM

## 2013-04-04 DIAGNOSIS — T783XXA Angioneurotic edema, initial encounter: Secondary | ICD-10-CM

## 2013-04-04 DIAGNOSIS — E669 Obesity, unspecified: Secondary | ICD-10-CM

## 2013-04-04 MED ORDER — METHYLPREDNISOLONE SODIUM SUCC 40 MG IJ SOLR
40.0000 mg | Freq: Two times a day (BID) | INTRAMUSCULAR | Status: DC
  Administered 2013-04-04 – 2013-04-05 (×2): 40 mg via INTRAVENOUS
  Filled 2013-04-04 (×2): qty 1

## 2013-04-04 MED ORDER — ACETAMINOPHEN 325 MG PO TABS
650.0000 mg | ORAL_TABLET | Freq: Four times a day (QID) | ORAL | Status: DC | PRN
  Administered 2013-04-04: 650 mg via ORAL
  Filled 2013-04-04: qty 2

## 2013-04-04 MED ORDER — DIPHENHYDRAMINE HCL 50 MG/ML IJ SOLN
25.0000 mg | Freq: Three times a day (TID) | INTRAMUSCULAR | Status: DC
  Administered 2013-04-04 – 2013-04-05 (×3): 25 mg via INTRAMUSCULAR
  Filled 2013-04-04 (×3): qty 1

## 2013-04-04 NOTE — Progress Notes (Signed)
UR chart review completed.  

## 2013-04-04 NOTE — Progress Notes (Signed)
[facility] PROGRESS NOTE  Lawrence Reynolds ZOX:[id number] DOB: 20-Feb-1941 DOA: 04/03/2013 PCP: Kirstie Peri, MD  Assessment/Plan: 1. Angioedema:  1. Pt still generally swollen 2. pt reports feeling "about the same." 3. Airways remain patent.  4. Tongue/lip remains swollen.  5. Currently on BID solumedrol with q6hr benadryl IV.  6. Consider weaning steroids today 2. Chest pain: 1. Stable. 2. Cardiac enzymes neg x 3 3. DVT prophylaxis 1. Lovenox subQ  Code Status: Full Family Communication: Pt in room (indicate person spoken with, relationship, and if by phone, the number) Disposition Plan: Pending   HPI/Subjective: No acute events noted overnight. Pt reports feeling "about the same" today.  Objective: Filed Vitals:   04/04/13 0300 04/04/13 0400 04/04/13 0500 04/04/13 0600  BP: 118/58 99/54 114/72 107/65  Pulse:      Temp:  98.3 F (36.8 C)    TempSrc:  Oral    Resp: 15 12 14 27   Height:      Weight:   123.4 kg (272 lb 0.8 oz)   SpO2:        Intake/Output Summary (Last 24 hours) at 04/04/13 0823 Last data filed at 04/04/13 0600  Gross per 24 hour  Intake 638.75 ml  Output    750 ml  Net -111.25 ml   Filed Weights   04/03/13 2110 04/04/13 0500  Weight: 122.2 kg (269 lb 6.4 oz) 123.4 kg (272 lb 0.8 oz)    Exam:   General:  Awake, in nad  Cardiovascular: regular, s1, s2  Respiratory: normal resp effort, no wheezing  Abdomen: soft, nondistended  Musculoskeletal: perfused, no clubbing   Data Reviewed: Basic Metabolic Panel:  Recent Labs Lab 04/03/13 1836  NA 137  K 3.6  CL 98  CO2 29  GLUCOSE 92  BUN 20  CREATININE 0.78  CALCIUM 9.2   Liver Function Tests: No results found for this basename: AST, ALT, ALKPHOS, BILITOT, PROT, ALBUMIN,  in the last 168 hours No results found for this basename: LIPASE, AMYLASE,  in the last 168 hours No results found for this basename: AMMONIA,  in the last 168 hours CBC:  Recent Labs Lab  04/03/13 1836  WBC 14.4*  NEUTROABS 7.8*  HGB 14.9  HCT 44.7  MCV 95.7  PLT 236   Cardiac Enzymes:  Recent Labs Lab 04/03/13 1836 04/03/13 2138 04/04/13 0312  TROPONINI <0.30 <0.30 <0.30   BNP (last 3 results) No results found for this basename: PROBNP,  in the last 8760 hours CBG: No results found for this basename: GLUCAP,  in the last 168 hours  Recent Results (from the past 240 hour(s))  MRSA PCR SCREENING     Status: None   Collection Time    04/03/13  9:15 PM      Result Value Range Status   MRSA by PCR NEGATIVE  NEGATIVE Final   Comment:            The GeneXpert MRSA Assay (FDA     approved for NASAL specimens     only), is one component of a     comprehensive MRSA colonization     surveillance program. It is not     intended to diagnose MRSA     infection nor to guide or     monitor treatment for     MRSA infections.     Studies: Dg Chest Portable 1 View  04/03/2013   *RADIOLOGY REPORT*  Clinical Data: Chest pain and lower lip swelling.  PORTABLE CHEST -  1 VIEW  Comparison: 04/19/2012 from [facility]  Findings: Apical lordotic positioning. Midline trachea. Cardiomegaly accentuated by AP portable technique.  No pleural effusion or pneumothorax.  No congestive failure.  Clear lungs.  IMPRESSION: Cardiomegaly without congestive failure.   Original Report Authenticated By: Jeronimo Greaves, M.D.    Scheduled Meds: . diphenhydrAMINE  25 mg Intramuscular Q6H  . enoxaparin (LOVENOX) injection  60 mg Subcutaneous Q24H  . methylPREDNISolone (SOLU-MEDROL) injection  60 mg Intravenous Q12H   Continuous Infusions: . sodium chloride 75 mL/hr at 04/04/13 0600    Active Problems:   Angioedema    Time spent:    Katelyn Broadnax K  [facility] Pager [phone]. If 7PM-7AM, please contact night-coverage at [email or web address], password [facility] 04/04/2013, 8:23 AM  LOS: 1 day

## 2013-04-04 NOTE — Plan of Care (Signed)
Problem: Consults Goal: General Medical Patient Education See Patient Education Module for specific education. Outcome: Progressing Pt was admitted with lower lip swelling after eating breakfast @ [address] Pt 's airway remains patent @ speech remains plain.   Problem: Phase I Progression Outcomes Goal: Pain controlled with appropriate interventions Outcome: Progressing The only pain pt has c/o is a H/A . Pt given prn RX & now is sleeping Goal: Voiding-avoid urinary catheter unless indicated Outcome: Progressing Pt has been voiding clear yellow urine per urinal. Goal: Hemodynamically stable Outcome: Progressing Vital signs have remained stable

## 2013-04-04 NOTE — Care Management Note (Addendum)
    Page 1 of 1   04/06/2013     9:50:41 AM   CARE MANAGEMENT NOTE 04/06/2013  Patient:  Lawrence Reynolds, Lawrence Reynolds   Account Number:  [id number]  Date Initiated:  04/04/2013  Documentation initiated by:  Sharrie Rothman  Subjective/Objective Assessment:   Pt admitted from home with angioedema. Pt lives with his wife and will return home at discharge. Pt is independent with ADL's.     Action/Plan:   No CM needs noted.   Anticipated DC Date:  04/05/2013   Anticipated DC Plan:  HOME/SELF CARE      DC Planning Services  CM consult      Choice offered to / List presented to:             Status of service:  Completed, signed off Medicare Important Message given?   (If response is "NO", the following Medicare IM given date fields will be blank) Date Medicare IM given:   Date Additional Medicare IM given:    Discharge Disposition:  HOME/SELF CARE  Per UR Regulation:    If discussed at Long Length of Stay Meetings, dates discussed:    Comments:  04/06/13 0950 Arlyss Queen, RN BSN CM Pt discharged home today. no CM needs noted.  04/04/13 1402 Arlyss Queen, RN BSN CM

## 2013-04-05 MED ORDER — METHYLPREDNISOLONE SODIUM SUCC 125 MG IJ SOLR
60.0000 mg | Freq: Every day | INTRAMUSCULAR | Status: DC
  Administered 2013-04-06: 60 mg via INTRAVENOUS
  Filled 2013-04-05: qty 2

## 2013-04-05 MED ORDER — DIPHENHYDRAMINE HCL 50 MG/ML IJ SOLN: 25.0000 mg | Freq: Four times a day (QID) | INTRAMUSCULAR | Status: DC | PRN

## 2013-04-05 NOTE — Progress Notes (Signed)
[facility] PROGRESS NOTE  Lawrence Reynolds YQM:[id number] DOB: 06-09-1941 DOA: 04/03/2013 PCP: Kirstie Peri, MD  Assessment/Plan: 1. Angioedema:  1. Per history, pt notes previous anaphylactic reaction to mouth swab 2-62yrs ago at [facility], requiring intubation 2. Swelling improved this AM 3. Airways remain patent.  4. Minimal tongue swelling today 5. Given extensive hx of prior anaphylactic rxn, will very cautiously wean steroids and change IV benadryl to PRN 6. Recommend referral to outpatient Allergist on d/c 2. Chest pain: 1. Stable. 2. Cardiac enzymes were neg x 3 3. DVT prophylaxis 1. Lovenox subQ  Code Status: Full Family Communication: Pt in room (indicate person spoken with, relationship, and if by phone, the number) Disposition Plan: Pending   HPI/Subjective: Swelling improved. No events overnight  Objective: Filed Vitals:   04/05/13 0300 04/05/13 0400 04/05/13 0500 04/05/13 0737  BP: 88/32 79/30 98/56    Pulse:      Temp:    97.4 F (36.3 C)  TempSrc:    Oral  Resp: 14 17    Height:      Weight:      SpO2:        Intake/Output Summary (Last 24 hours) at 04/05/13 0820 Last data filed at 04/05/13 5284  Gross per 24 hour  Intake 2582.5 ml  Output      0 ml  Net 2582.5 ml   Filed Weights   04/03/13 2110 04/04/13 0500  Weight: 122.2 kg (269 lb 6.4 oz) 123.4 kg (272 lb 0.8 oz)    Exam:   General:  Awake, in nad, minimal lip swelling  Cardiovascular: regular, s1, s2  Respiratory: normal resp effort, no wheezing  Abdomen: soft, nondistended  Musculoskeletal: perfused, no clubbing   Data Reviewed: Basic Metabolic Panel:  Recent Labs Lab 04/03/13 1836  NA 137  K 3.6  CL 98  CO2 29  GLUCOSE 92  BUN 20  CREATININE 0.78  CALCIUM 9.2   Liver Function Tests: No results found for this basename: AST, ALT, ALKPHOS, BILITOT, PROT, ALBUMIN,  in the last 168 hours No results found for this basename: LIPASE, AMYLASE,  in the last 168  hours No results found for this basename: AMMONIA,  in the last 168 hours CBC:  Recent Labs Lab 04/03/13 1836  WBC 14.4*  NEUTROABS 7.8*  HGB 14.9  HCT 44.7  MCV 95.7  PLT 236   Cardiac Enzymes:  Recent Labs Lab 04/03/13 1836 04/03/13 2138 04/04/13 0312 04/04/13 0910  TROPONINI <0.30 <0.30 <0.30 <0.30   BNP (last 3 results) No results found for this basename: PROBNP,  in the last 8760 hours CBG: No results found for this basename: GLUCAP,  in the last 168 hours  Recent Results (from the past 240 hour(s))  MRSA PCR SCREENING     Status: None   Collection Time    04/03/13  9:15 PM      Result Value Range Status   MRSA by PCR NEGATIVE  NEGATIVE Final   Comment:            The GeneXpert MRSA Assay (FDA     approved for NASAL specimens     only), is one component of a     comprehensive MRSA colonization     surveillance program. It is not     intended to diagnose MRSA     infection nor to guide or     monitor treatment for     MRSA infections.     Studies: Dg Chest Portable 1  View  04/03/2013   *RADIOLOGY REPORT*  Clinical Data: Chest pain and lower lip swelling.  PORTABLE CHEST - 1 VIEW  Comparison: 04/19/2012 from [facility]  Findings: Apical lordotic positioning. Midline trachea. Cardiomegaly accentuated by AP portable technique.  No pleural effusion or pneumothorax.  No congestive failure.  Clear lungs.  IMPRESSION: Cardiomegaly without congestive failure.   Original Report Authenticated By: Jeronimo Greaves, M.D.    Scheduled Meds: . enoxaparin (LOVENOX) injection  60 mg Subcutaneous Q24H  . [START ON 04/06/2013] methylPREDNISolone (SOLU-MEDROL) injection  60 mg Intravenous Daily   Continuous Infusions: . sodium chloride 75 mL/hr at 04/05/13 1610    Active Problems:   Angioedema    Time spent:    Mitchelle Goerner K  [facility] Pager [phone]. If 7PM-7AM, please contact night-coverage at [email or web address], password [facility] 04/05/2013, 8:20 AM   LOS: 2 days

## 2013-04-05 NOTE — Progress Notes (Signed)
Report called and given to Gwinda Maine, RN. Patient being transferred to dept 300. Patient alert, oriented and in stable condition at the time of transfer.

## 2013-04-05 NOTE — Progress Notes (Signed)
UR chart review completed.  

## 2013-04-06 MED ORDER — PREDNISONE 20 MG PO TABS: ORAL_TABLET | ORAL | Status: DC

## 2013-04-06 MED ORDER — EPINEPHRINE 0.3 MG/0.3ML IJ SOAJ: 1.0000 mg | Freq: Once | INTRAMUSCULAR | Status: AC

## 2013-04-06 MED ORDER — DIPHENHYDRAMINE HCL 25 MG PO TABS: 25.0000 mg | ORAL_TABLET | Freq: Four times a day (QID) | ORAL | Status: DC | PRN

## 2013-04-06 NOTE — Discharge Summary (Signed)
Physician Discharge Summary  Lawrence Reynolds Lawrence Reynolds DOB: 10-08-1940 DOA: 04/03/2013  PCP: Kirstie Peri, MD  Admit date: 04/03/2013 Discharge date: 04/06/2013  Time spent: Greater than 30 minutes  Recommendations for Outpatient Follow-up:  1. Follow with primary care physician for referral to an allergist.  Discharge Diagnoses:  1. Angioedema, resolved.   Discharge Condition: Stable and improved.  Diet recommendation: Regular.  Filed Weights   04/03/13 2110 04/04/13 0500  Weight: 122.2 kg (269 lb 6.4 oz) 123.4 kg (272 lb 0.8 oz)    History of present illness:  This 72 year old man presented to the hospital with symptoms of lip swelling. Please see initial history as outlined below: HPI:  72 year old male with history of a to sleep apnea, BPH came to the hospital after he developed lip swelling. As per patient's wife they had breakfast at [facility], and patient started developing upper lip swelling. He came back and developed some headache, patient took [facility] powder and slept, when he woke up he had large lower lip swelling. Patient did not have tongue swelling. Denies shortness of breath, no difficulty swallowing. In the ED patient was given epinephrine injection, Solu-Medrol and Benadryl. Patient also complained of chest pain which developed when he was coming to the ED, last about 2 hours. Pain was done with 10 in intensity and it has resolved at this time. Facet of cardiac enzymes is negative EKG does not show significant changes. Patient denies shortness of breath. As per family, patient had a similar episode in the past when before surgery patient's mouth was swabbed and after that he developed lip swelling which was so severe it involved his tongue and required intubation.  Hospital Course:  The patient was admitted and started on intravenous steroids and supportive measures. He was given epinephrine injection in the emergency room followed by Solu-Medrol intravenously and  Benadryl intravenously. He made and expected recovery and swelling of his lips improve. During no time did he really have any airway compromise. He does have a history of severe allergic reaction requiring intubation previously. He is now stable for discharge. He is going to be discharged with prescription for prednisone, Benadryl and also an EpiPen. He must see his primary care physician for referral to an allergist soon.  Procedures:  None.   Consultations:  None.  Discharge Exam: Filed Vitals:   04/06/13 0543  BP: 131/71  Pulse: 55  Temp: 98.1 F (36.7 C)  Resp: 18    General: He looks systemically well. There is no lip or tongue swelling. Cardiovascular: Heart sounds are present without murmurs or added sounds. Respiratory: Lung fields are entirely clear without any wheezing. There is no stridor. There is no respiratory distress.  Discharge Instructions  Discharge Orders   Future Orders Complete By Expires   Diet - low sodium heart healthy  As directed    Increase activity slowly  As directed        Medication List    STOP taking these medications       sulfamethoxazole-trimethoprim 800-160 MG per tablet  Commonly known as:  BACTRIM DS      TAKE these medications       aspirin EC 81 MG tablet  Take 81 mg by mouth daily.     diphenhydrAMINE 25 MG tablet  Commonly known as:  BENADRYL  Take 1 tablet (25 mg total) by mouth every 6 (six) hours as needed for itching or allergies.     EPINEPHrine 0.3 mg/0.3 mL Soaj injection  Commonly  known as:  EPIPEN  Inject 1 mL (1 mg total) into the muscle once.     multivitamin with minerals Tabs tablet  Take 1 tablet by mouth daily.     predniSONE 20 MG tablet  Commonly known as:  DELTASONE  Take 2 tablets immediately when you are having an allergic reaction. Then take 2 tablets daily for 5 days.     primidone 50 MG tablet  Commonly known as:  MYSOLINE  Take 100 mg by mouth 2 (two) times daily.     tamsulosin 0.4 MG  Caps capsule  Commonly known as:  FLOMAX  Take 0.4 mg by mouth every evening.     triamcinolone cream 0.1 %  Commonly known as:  KENALOG  Apply 1 application topically daily as needed.     VITAMIN E PO  Take 1 tablet by mouth daily.       No Known Allergies     Follow-up Information   Follow up with [facility], [place]. (Your primary care physician needs to refer you to an allergist.)    Specialty:  Internal Medicine   Contact information:   [address]  [place] [address] [phone]        The results of significant diagnostics from this hospitalization (including imaging, microbiology, ancillary and laboratory) are listed below for reference.    Significant Diagnostic Studies: Dg Chest Portable 1 View  04/03/2013   *RADIOLOGY REPORT*  Clinical Data: Chest pain and lower lip swelling.  PORTABLE CHEST - 1 VIEW  Comparison: 04/19/2012 from [facility]  Findings: Apical lordotic positioning. Midline trachea. Cardiomegaly accentuated by AP portable technique.  No pleural effusion or pneumothorax.  No congestive failure.  Clear lungs.  IMPRESSION: Cardiomegaly without congestive failure.   Original Report Authenticated By: Jeronimo Greaves, M.D.    Microbiology: Recent Results (from the past 240 hour(s))  MRSA PCR SCREENING     Status: None   Collection Time    04/03/13  9:15 PM      Result Value Range Status   MRSA by PCR NEGATIVE  NEGATIVE Final   Comment:            The GeneXpert MRSA Assay (FDA     approved for NASAL specimens     only), is one component of a     comprehensive MRSA colonization     surveillance program. It is not     intended to diagnose MRSA     infection nor to guide or     monitor treatment for     MRSA infections.     Labs: Basic Metabolic Panel:  Recent Labs Lab 04/03/13 1836  NA 137  K 3.6  CL 98  CO2 29  GLUCOSE 92  BUN 20  CREATININE 0.78  CALCIUM 9.2       CBC:  Recent Labs Lab 04/03/13 1836  WBC 14.4*  NEUTROABS  7.8*  HGB 14.9  HCT 44.7  MCV 95.7  PLT 236   Cardiac Enzymes:  Recent Labs Lab 04/03/13 1836 04/03/13 2138 04/04/13 0312 04/04/13 0910  TROPONINI <0.30 <0.30 <0.30 <0.30        Signed:  Lilly Cove C  [facility] 04/06/2013, 9:49 AM

## 2013-04-06 NOTE — Progress Notes (Signed)
Patient given instructions and prescriptions. Verbalizes understanding. IV cath removed and intact. No pain/swelling at site.

## 2013-10-14 ENCOUNTER — Inpatient Hospital Stay (HOSPITAL_COMMUNITY)
Admission: EM | Admit: 2013-10-14 | Discharge: 2013-10-16 | DRG: 392 | Disposition: A | Discharge status: Home / Self Care | Payer: Medicare Other | Attending: Internal Medicine | Admitting: Internal Medicine

## 2013-10-14 ENCOUNTER — Encounter (HOSPITAL_COMMUNITY): Payer: Self-pay | Admitting: Emergency Medicine

## 2013-10-14 ENCOUNTER — Emergency Department (HOSPITAL_COMMUNITY): Payer: Medicare Other

## 2013-10-14 DIAGNOSIS — K5900 Constipation, unspecified: Secondary | ICD-10-CM

## 2013-10-14 DIAGNOSIS — K529 Noninfective gastroenteritis and colitis, unspecified: Secondary | ICD-10-CM | POA: Diagnosis present

## 2013-10-14 DIAGNOSIS — F528 Other sexual dysfunction not due to a substance or known physiological condition: Secondary | ICD-10-CM

## 2013-10-14 DIAGNOSIS — M545 Low back pain, unspecified: Secondary | ICD-10-CM

## 2013-10-14 DIAGNOSIS — Z8719 Personal history of other diseases of the digestive system: Secondary | ICD-10-CM

## 2013-10-14 DIAGNOSIS — M1710 Unilateral primary osteoarthritis, unspecified knee: Secondary | ICD-10-CM

## 2013-10-14 DIAGNOSIS — D72829 Elevated white blood cell count, unspecified: Secondary | ICD-10-CM

## 2013-10-14 DIAGNOSIS — T783XXA Angioneurotic edema, initial encounter: Secondary | ICD-10-CM

## 2013-10-14 DIAGNOSIS — R51 Headache: Secondary | ICD-10-CM

## 2013-10-14 DIAGNOSIS — N400 Enlarged prostate without lower urinary tract symptoms: Secondary | ICD-10-CM | POA: Diagnosis present

## 2013-10-14 DIAGNOSIS — IMO0002 Reserved for concepts with insufficient information to code with codable children: Secondary | ICD-10-CM

## 2013-10-14 DIAGNOSIS — G4733 Obstructive sleep apnea (adult) (pediatric): Secondary | ICD-10-CM

## 2013-10-14 DIAGNOSIS — R197 Diarrhea, unspecified: Secondary | ICD-10-CM

## 2013-10-14 DIAGNOSIS — E291 Testicular hypofunction: Secondary | ICD-10-CM

## 2013-10-14 DIAGNOSIS — N138 Other obstructive and reflux uropathy: Secondary | ICD-10-CM

## 2013-10-14 DIAGNOSIS — G250 Essential tremor: Secondary | ICD-10-CM

## 2013-10-14 DIAGNOSIS — E669 Obesity, unspecified: Secondary | ICD-10-CM

## 2013-10-14 DIAGNOSIS — K429 Umbilical hernia without obstruction or gangrene: Secondary | ICD-10-CM

## 2013-10-14 DIAGNOSIS — K439 Ventral hernia without obstruction or gangrene: Secondary | ICD-10-CM

## 2013-10-14 DIAGNOSIS — N401 Enlarged prostate with lower urinary tract symptoms: Secondary | ICD-10-CM

## 2013-10-14 DIAGNOSIS — Z96659 Presence of unspecified artificial knee joint: Secondary | ICD-10-CM

## 2013-10-14 DIAGNOSIS — R030 Elevated blood-pressure reading, without diagnosis of hypertension: Secondary | ICD-10-CM

## 2013-10-14 DIAGNOSIS — R112 Nausea with vomiting, unspecified: Secondary | ICD-10-CM

## 2013-10-14 DIAGNOSIS — Z9049 Acquired absence of other specified parts of digestive tract: Secondary | ICD-10-CM

## 2013-10-14 DIAGNOSIS — K5289 Other specified noninfective gastroenteritis and colitis: Principal | ICD-10-CM | POA: Diagnosis present

## 2013-10-14 DIAGNOSIS — R109 Unspecified abdominal pain: Secondary | ICD-10-CM

## 2013-10-14 DIAGNOSIS — K219 Gastro-esophageal reflux disease without esophagitis: Secondary | ICD-10-CM

## 2013-10-14 DIAGNOSIS — E785 Hyperlipidemia, unspecified: Secondary | ICD-10-CM

## 2013-10-14 DIAGNOSIS — F411 Generalized anxiety disorder: Secondary | ICD-10-CM

## 2013-10-14 DIAGNOSIS — M1990 Unspecified osteoarthritis, unspecified site: Secondary | ICD-10-CM

## 2013-10-14 DIAGNOSIS — G252 Other specified forms of tremor: Secondary | ICD-10-CM

## 2013-10-14 DIAGNOSIS — Z6841 Body Mass Index (BMI) 40.0 and over, adult: Secondary | ICD-10-CM

## 2013-10-14 HISTORY — DX: Diverticulitis of intestine, part unspecified, without perforation or abscess without bleeding: K57.92

## 2013-10-14 LAB — COMPREHENSIVE METABOLIC PANEL
ALT: 69 U/L — AB (ref 0–53)
AST: 42 U/L — AB (ref 0–37)
Albumin: 3.8 g/dL (ref 3.5–5.2)
Alkaline Phosphatase: 65 U/L (ref 39–117)
BUN: 24 mg/dL — ABNORMAL HIGH (ref 6–23)
CALCIUM: 8.9 mg/dL (ref 8.4–10.5)
CHLORIDE: 99 meq/L (ref 96–112)
CO2: 25 meq/L (ref 19–32)
Creatinine, Ser: 0.95 mg/dL (ref 0.50–1.35)
GFR calc Af Amer: >90 mL/min (ref >90)
GFR, EST NON AFRICAN AMERICAN: 81 mL/min — AB (ref >90)
Glucose, Bld: 139 mg/dL — ABNORMAL HIGH (ref 70–99)
Potassium: 3.5 mEq/L — ABNORMAL LOW (ref 3.7–5.3)
SODIUM: 138 meq/L (ref 137–147)
Total Bilirubin: 0.7 mg/dL (ref 0.3–1.2)
Total Protein: 7.5 g/dL (ref 6.0–8.3)

## 2013-10-14 LAB — CBC WITH DIFFERENTIAL/PLATELET
BASOS ABS: 0.1 10*3/uL (ref 0.0–0.1)
Basophils Relative: 0 % (ref 0–1)
Eosinophils Absolute: 0.1 10*3/uL (ref 0.0–0.7)
Eosinophils Relative: 1 % (ref 0–5)
HCT: 46 % (ref 39.0–52.0)
Hemoglobin: 15.4 g/dL (ref 13.0–17.0)
LYMPHS PCT: 14 % (ref 12–46)
Lymphs Abs: 2 10*3/uL (ref 0.7–4.0)
MCH: 31.3 pg (ref 26.0–34.0)
MCHC: 33.5 g/dL (ref 30.0–36.0)
MCV: 93.5 fL (ref 78.0–100.0)
Monocytes Absolute: 1.7 10*3/uL — ABNORMAL HIGH (ref 0.1–1.0)
Monocytes Relative: 11 % (ref 3–12)
NEUTROS ABS: 10.7 10*3/uL — AB (ref 1.7–7.7)
NEUTROS PCT: 74 % (ref 43–77)
PLATELETS: 252 10*3/uL (ref 150–400)
RBC: 4.92 MIL/uL (ref 4.22–5.81)
RDW: 13.4 % (ref 11.5–15.5)
WBC: 14.5 10*3/uL — AB (ref 4.0–10.5)

## 2013-10-14 LAB — URINALYSIS, ROUTINE W REFLEX MICROSCOPIC
Bilirubin Urine: NEGATIVE
GLUCOSE, UA: NEGATIVE mg/dL
Ketones, ur: NEGATIVE mg/dL
Leukocytes, UA: NEGATIVE
Nitrite: NEGATIVE
Specific Gravity, Urine: >1.03 — ABNORMAL HIGH (ref 1.005–1.030)
Urobilinogen, UA: 0.2 mg/dL (ref 0.0–1.0)
pH: 6 (ref 5.0–8.0)

## 2013-10-14 LAB — URINE MICROSCOPIC-ADD ON

## 2013-10-14 LAB — LIPASE, BLOOD: Lipase: 32 U/L (ref 11–59)

## 2013-10-14 MED ORDER — POTASSIUM CHLORIDE IN NACL 20-0.9 MEQ/L-% IV SOLN
INTRAVENOUS | Status: DC
  Administered 2013-10-14 – 2013-10-15 (×3): via INTRAVENOUS

## 2013-10-14 MED ORDER — IOHEXOL 300 MG/ML  SOLN
100.0000 mL | Freq: Once | INTRAMUSCULAR | Status: AC | PRN
  Administered 2013-10-14: 100 mL via INTRAVENOUS

## 2013-10-14 MED ORDER — ACETAMINOPHEN 650 MG RE SUPP: 650.0000 mg | Freq: Four times a day (QID) | RECTAL | Status: DC | PRN

## 2013-10-14 MED ORDER — ENOXAPARIN SODIUM 40 MG/0.4ML ~~LOC~~ SOLN
40.0000 mg | SUBCUTANEOUS | Status: DC
Start: 2013-10-14 — End: 2013-10-14
  Administered 2013-10-14: 40 mg via SUBCUTANEOUS
  Filled 2013-10-14: qty 0.4

## 2013-10-14 MED ORDER — METRONIDAZOLE IN NACL 5-0.79 MG/ML-% IV SOLN
500.0000 mg | Freq: Three times a day (TID) | INTRAVENOUS | Status: DC
  Administered 2013-10-14 – 2013-10-16 (×6): 500 mg via INTRAVENOUS
  Filled 2013-10-14 (×6): qty 100

## 2013-10-14 MED ORDER — ONDANSETRON HCL 4 MG/2ML IJ SOLN: 4.0000 mg | Freq: Four times a day (QID) | INTRAMUSCULAR | Status: DC | PRN

## 2013-10-14 MED ORDER — ENOXAPARIN SODIUM 60 MG/0.6ML ~~LOC~~ SOLN
60.0000 mg | Freq: Every day | SUBCUTANEOUS | Status: DC
  Administered 2013-10-15: 60 mg via SUBCUTANEOUS
  Filled 2013-10-14: qty 0.6

## 2013-10-14 MED ORDER — SODIUM CHLORIDE 0.9 % IV SOLN: INTRAVENOUS | Status: DC

## 2013-10-14 MED ORDER — IOHEXOL 300 MG/ML  SOLN
50.0000 mL | Freq: Once | INTRAMUSCULAR | Status: AC | PRN
  Administered 2013-10-14: 50 mL via ORAL

## 2013-10-14 MED ORDER — TAMSULOSIN HCL 0.4 MG PO CAPS
0.4000 mg | ORAL_CAPSULE | Freq: Every evening | ORAL | Status: DC
  Administered 2013-10-14 – 2013-10-15 (×2): 0.4 mg via ORAL
  Filled 2013-10-14 (×3): qty 1

## 2013-10-14 MED ORDER — ASPIRIN EC 81 MG PO TBEC
81.0000 mg | DELAYED_RELEASE_TABLET | Freq: Every day | ORAL | Status: DC
  Filled 2013-10-14: qty 1

## 2013-10-14 MED ORDER — EPINEPHRINE 0.3 MG/0.3ML IJ SOAJ
1.0000 mg | INTRAMUSCULAR | Status: DC | PRN
  Filled 2013-10-14: qty 1.2

## 2013-10-14 MED ORDER — SODIUM CHLORIDE 0.9 % IV BOLUS (SEPSIS)
500.0000 mL | Freq: Once | INTRAVENOUS | Status: AC
Start: 2013-10-14 — End: 2013-10-14
  Administered 2013-10-14: 500 mL via INTRAVENOUS

## 2013-10-14 MED ORDER — ONDANSETRON HCL 4 MG/2ML IJ SOLN
4.0000 mg | Freq: Once | INTRAMUSCULAR | Status: AC
  Administered 2013-10-14: 4 mg via INTRAVENOUS
  Filled 2013-10-14: qty 2

## 2013-10-14 MED ORDER — CIPROFLOXACIN IN D5W 400 MG/200ML IV SOLN
400.0000 mg | Freq: Two times a day (BID) | INTRAVENOUS | Status: DC
  Administered 2013-10-14 – 2013-10-15 (×4): 400 mg via INTRAVENOUS
  Filled 2013-10-14 (×4): qty 200

## 2013-10-14 MED ORDER — ASPIRIN EC 81 MG PO TBEC
81.0000 mg | DELAYED_RELEASE_TABLET | Freq: Every day | ORAL | Status: DC
  Administered 2013-10-14 – 2013-10-16 (×3): 81 mg via ORAL
  Filled 2013-10-14 (×3): qty 1

## 2013-10-14 MED ORDER — ASPIRIN 81 MG PO CHEW
CHEWABLE_TABLET | ORAL | Status: AC
Start: 2013-10-14 — End: 2013-10-14
  Filled 2013-10-14: qty 1

## 2013-10-14 MED ORDER — MORPHINE SULFATE 4 MG/ML IJ SOLN
4.0000 mg | Freq: Once | INTRAMUSCULAR | Status: AC
  Administered 2013-10-14: 4 mg via INTRAVENOUS
  Filled 2013-10-14: qty 1

## 2013-10-14 MED ORDER — PRIMIDONE 50 MG PO TABS
100.0000 mg | ORAL_TABLET | Freq: Two times a day (BID) | ORAL | Status: DC
  Administered 2013-10-14 – 2013-10-16 (×5): 100 mg via ORAL
  Filled 2013-10-14 (×7): qty 2

## 2013-10-14 MED ORDER — ACETAMINOPHEN 325 MG PO TABS
650.0000 mg | ORAL_TABLET | Freq: Four times a day (QID) | ORAL | Status: DC | PRN
  Filled 2013-10-14: qty 2

## 2013-10-14 MED ORDER — ONDANSETRON HCL 4 MG PO TABS: 4.0000 mg | ORAL_TABLET | Freq: Four times a day (QID) | ORAL | Status: DC | PRN

## 2013-10-14 NOTE — ED Notes (Signed)
Dr. Arnoldo Morale in with the patient to discuss plan of care.

## 2013-10-14 NOTE — ED Notes (Signed)
Pharmacy aware needing mysoline

## 2013-10-14 NOTE — ED Provider Notes (Signed)
Care assumed from Dr. Lita Mains. Historically and clinically patient has symptoms and findings most consistent with gastroenteritis. D. scan suggests a partial small bowel obstruction. Patient has continued to have bowel movements without diarrhea and his wife had a gastroenteritis a week before. I discussed case with Dr. Emilia Beck of general surgery. He has seen the patient in consult. He does feel this is an acute surgical condition at a surgery intervention is required. Patient is still symptomatic with discomfort and nausea. I placed a call the [facility] regarding admission. He will require additional fluids and antiemetics.  Tanna Furry, MD 10/14/13 [phone]

## 2013-10-14 NOTE — ED Notes (Signed)
Pt. Reports diarrhea, nausea, vomiting since Tuesday. Pt. Reports generalized abdominal pain.

## 2013-10-14 NOTE — ED Provider Notes (Signed)
CSN: [id number]     Arrival date & time 10/14/13  0532 History   First MD Initiated Contact with Patient 10/14/13 0542     Chief Complaint  Patient presents with  . Emesis  . Diarrhea     (Consider location/radiation/quality/duration/timing/severity/associated sxs/prior Treatment) HPI Patient presents with 6 days of nausea, vomiting, diarrhea and diffuse abdominal pain. His wife had similar symptoms. She's had subjective fevers. He's had no recent vomiting but still feels nauseated. He denies any blood in the stool. Describes his stool as watery. He's had multiple episodes daily of diarrhea. States he's had decreased urination. His abdominal pain is constant and diffuse. Past Medical History  Diagnosis Date  . Diverticulitis    Past Surgical History  Procedure Laterality Date  . Hernia repair    . Joint replacement    . Cholecystectomy    . Back surgery    . Total knee arthroplasty    . Small intestine surgery     No family history on file. History  Substance Use Topics  . Smoking status: Never Smoker   . Smokeless tobacco: Not on file  . Alcohol Use: No    Review of Systems  Constitutional: Positive for fever and fatigue. Negative for chills.  Respiratory: Negative for cough and shortness of breath.   Cardiovascular: Negative for chest pain.  Gastrointestinal: Positive for nausea, vomiting, abdominal pain and diarrhea. Negative for blood in stool.  Genitourinary: Negative for dysuria, frequency and flank pain.  Musculoskeletal: Negative for back pain, neck pain and neck stiffness.  Skin: Negative for rash and wound.  Neurological: Negative for dizziness, weakness, light-headedness, numbness and headaches.  All other systems reviewed and are negative.      Allergies  Review of patient's allergies indicates no known allergies.  Home Medications   Current Outpatient Rx  Name  Route  Sig  Dispense  Refill  . aspirin EC 81 MG tablet   Oral   Take 81 mg by mouth  daily.         . diphenhydrAMINE (BENADRYL) 25 MG tablet   Oral   Take 1 tablet (25 mg total) by mouth every 6 (six) hours as needed for itching or allergies.   30 tablet   0   . EPINEPHrine (EPIPEN) 0.3 mg/0.3 mL SOAJ injection   Intramuscular   Inject 1 mL (1 mg total) into the muscle once.   1 Device   6   . Multiple Vitamin (MULTIVITAMIN WITH MINERALS) TABS   Oral   Take 1 tablet by mouth daily.         . predniSONE (DELTASONE) 20 MG tablet      Take 2 tablets immediately when you are having an allergic reaction. Then take 2 tablets daily for 5 days.   30 tablet   0   . primidone (MYSOLINE) 50 MG tablet   Oral   Take 100 mg by mouth 2 (two) times daily.          . Tamsulosin HCl (FLOMAX) 0.4 MG CAPS   Oral   Take 0.4 mg by mouth every evening.          . triamcinolone cream (KENALOG) 0.1 %   Topical   Apply 1 application topically daily as needed.         Marland Kitchen VITAMIN E PO   Oral   Take 1 tablet by mouth daily.          There were no vitals taken for this visit.  Physical Exam  Nursing note and vitals reviewed. Constitutional: He is oriented to person, place, and time. He appears well-developed and well-nourished. No distress.  HENT:  Head: Normocephalic and atraumatic.  Mouth/Throat: Oropharynx is clear and moist.  Eyes: EOM are normal. Pupils are equal, round, and reactive to light.  Neck: Normal range of motion. Neck supple.  Cardiovascular: Normal rate and regular rhythm.   Pulmonary/Chest: Effort normal and breath sounds normal. No respiratory distress. He has no wheezes. He has no rales. He exhibits no tenderness.  Abdominal: Soft. Bowel sounds are normal. He exhibits no distension and no mass. There is tenderness (generalized abdominal tenderness to palpation without rebound, guarding or focality). There is no rebound and no guarding.  Musculoskeletal: Normal range of motion. He exhibits no edema and no tenderness.  Neurological: He is alert  and oriented to person, place, and time.  Moves all extremities without deficit. Sensation is grossly intact.  Skin: Skin is warm and dry. No rash noted. No erythema.  Psychiatric: He has a normal mood and affect. His behavior is normal.    ED Course  Procedures (including critical care time) Labs Review Labs Reviewed  CBC WITH DIFFERENTIAL  COMPREHENSIVE METABOLIC PANEL  LIPASE, BLOOD  URINALYSIS, ROUTINE W REFLEX MICROSCOPIC   Imaging Review No results found.   EKG Interpretation None      MDM   Final diagnoses:  None   Signed out to oncoming emergency physician pending CT scan and disposition.     Julianne Rice, MD 10/15/13 0001

## 2013-10-14 NOTE — ED Notes (Signed)
Floor unable to take report at this time.

## 2013-10-14 NOTE — Consult Note (Signed)
Reason for Consult: Abdominal pain and distention Referring Physician: ER  Lawrence Reynolds is an 73 y.o. male.  HPI: Patient is a 73 year old white male who presents to the emergency room with a five-day history of diarrhea. Apparently his wife had a similar episode of diarrhea and abdominal cramping, though hers has resolved. He has felt nauseated, but has not thrown up. He says his stools were watery and had been nonbloody. He goes to the bathroom greater than 8 times a day. I last saw him in my office for incisional herniorrhaphy with mesh in 2008. He apparently has had previous hernia repair and a partial colectomy for diverticulitis in the remote past. He denies any fever or chills. He does feel weak.  Past Medical History  Diagnosis Date  . Diverticulitis     Past Surgical History  Procedure Laterality Date  . Hernia repair    . Joint replacement    . Cholecystectomy    . Back surgery    . Total knee arthroplasty    . Small intestine surgery      No family history on file.  Social History:  reports that he has never smoked. He does not have any smokeless tobacco history on file. He reports that he does not drink alcohol or use illicit drugs.  Allergies: No Known Allergies  Medications: I have reviewed the patient's current medications.  Results for orders placed during the hospital encounter of 10/14/13 (from the past 48 hour(s))  CBC WITH DIFFERENTIAL     Status: Abnormal   Collection Time    10/14/13  5:58 AM      Result Value Ref Range   WBC 14.5 (*) 4.0 - 10.5 K/uL   RBC 4.92  4.22 - 5.81 MIL/uL   Hemoglobin 15.4  13.0 - 17.0 g/dL   HCT 46.0  39.0 - 52.0 %   MCV 93.5  78.0 - 100.0 fL   MCH 31.3  26.0 - 34.0 pg   MCHC 33.5  30.0 - 36.0 g/dL   RDW 13.4  11.5 - 15.5 %   Platelets 252  150 - 400 K/uL   Neutrophils Relative % 74  43 - 77 %   Neutro Abs 10.7 (*) 1.7 - 7.7 K/uL   Lymphocytes Relative 14  12 - 46 %   Lymphs Abs 2.0  0.7 - 4.0 K/uL   Monocytes  Relative 11  3 - 12 %   Monocytes Absolute 1.7 (*) 0.1 - 1.0 K/uL   Eosinophils Relative 1  0 - 5 %   Eosinophils Absolute 0.1  0.0 - 0.7 K/uL   Basophils Relative 0  0 - 1 %   Basophils Absolute 0.1  0.0 - 0.1 K/uL  COMPREHENSIVE METABOLIC PANEL     Status: Abnormal   Collection Time    10/14/13  5:58 AM      Result Value Ref Range   Sodium 138  137 - 147 mEq/L   Potassium 3.5 (*) 3.7 - 5.3 mEq/L   Chloride 99  96 - 112 mEq/L   CO2 25  19 - 32 mEq/L   Glucose, Bld 139 (*) 70 - 99 mg/dL   BUN 24 (*) 6 - 23 mg/dL   Creatinine, Ser 0.95  0.50 - 1.35 mg/dL   Calcium 8.9  8.4 - 10.5 mg/dL   Total Protein 7.5  6.0 - 8.3 g/dL   Albumin 3.8  3.5 - 5.2 g/dL   AST 42 (*) 0 - 37 U/L  ALT 69 (*) 0 - 53 U/L   Alkaline Phosphatase 65  39 - 117 U/L   Total Bilirubin 0.7  0.3 - 1.2 mg/dL   GFR calc non Af Amer 81 (*) >90 mL/min   GFR calc Af Amer >90  >90 mL/min   Comment: (NOTE)     The eGFR has been calculated using the CKD EPI equation.     This calculation has not been validated in all clinical situations.     eGFR's persistently <90 mL/min signify possible Chronic Kidney     Disease.  LIPASE, BLOOD     Status: None   Collection Time    10/14/13  5:58 AM      Result Value Ref Range   Lipase 32  11 - 59 U/L  URINALYSIS, ROUTINE W REFLEX MICROSCOPIC     Status: Abnormal   Collection Time    10/14/13  8:35 AM      Result Value Ref Range   Color, Urine ORANGE (*) YELLOW   Comment: BIOCHEMICALS MAY BE AFFECTED BY COLOR   APPearance CLEAR  CLEAR   Specific Gravity, Urine >1.030 (*) 1.005 - 1.030   pH 6.0  5.0 - 8.0   Glucose, UA NEGATIVE  NEGATIVE mg/dL   Hgb urine dipstick TRACE (*) NEGATIVE   Bilirubin Urine NEGATIVE  NEGATIVE   Ketones, ur NEGATIVE  NEGATIVE mg/dL   Protein, ur TRACE (*) NEGATIVE mg/dL   Urobilinogen, UA 0.2  0.0 - 1.0 mg/dL   Nitrite NEGATIVE  NEGATIVE   Leukocytes, UA NEGATIVE  NEGATIVE  URINE MICROSCOPIC-ADD ON     Status: Abnormal   Collection Time     10/14/13  8:35 AM      Result Value Ref Range   Squamous Epithelial / LPF RARE  RARE   WBC, UA 0-2  <3 WBC/hpf   RBC / HPF 0-2  <3 RBC/hpf   Bacteria, UA RARE  RARE   Casts HYALINE CASTS (*) NEGATIVE    Ct Abdomen Pelvis W Contrast  10/14/2013   CLINICAL DATA:  Abdominal pain  EXAM: CT ABDOMEN AND PELVIS WITH CONTRAST  TECHNIQUE: Multidetector CT imaging of the abdomen and pelvis was performed using the standard protocol following bolus administration of intravenous contrast.  CONTRAST:  162m OMNIPAQUE IOHEXOL 300 MG/ML SOLN, 547mOMNIPAQUE IOHEXOL 300 MG/ML SOLN  COMPARISON:  06/23/2012  FINDINGS: The lung bases are free of acute infiltrate or sizable effusion. The liver is diffusely fatty infiltrated. The gallbladder has been surgically removed. The spleen, adrenal glands and pancreas are unremarkable. The kidneys are well visualized bilaterally. Cystic lesions are identified bilaterally as well as some scarring. The dominant cyst on the right has increased in size and now measures 2.3 cm in greatest dimension.  Mild small bowel dilatation is identified. No definitive transition zone is identified although some of the more distal jejunum/proximal ileal loops superior associated with the anterior abdominal wall in these changes may be related to underlying adhesions. No free air is seen. No free fluid is noted. Postsurgical changes are seen. The appendix is not visualized and may be surgically removed. No inflammatory changes are noted.  The bladder is decompressed. No pelvic mass lesion is noted. Degenerative changes of lumbar spine are noted.  IMPRESSION: Small bowel dilatation within the jejunum without definitive transition zone. There are changes suggestive of the adhesions likely related to prior surgery.  Chronic changes as described above.   Electronically Signed   By: MaLinus Mako.  On: 10/14/2013 08:26    ROS: See chart Blood pressure 120/63, pulse 82, temperature 97.9 F (36.6 C),  temperature source Oral, resp. rate 14, height 5' 9"  (1.753 m), weight 122.471 kg (270 lb), SpO2 97.00%. Physical Exam: Pleasant white male no acute distress. He looks fatigued. Abdomen is rotund, soft. Bowel sounds are appreciated. No specific tenderness is noted. No rigidity is noted. Difficult to appreciate any hepatosplenomegaly. No incisional hernias are appreciated. Rectal examination was deferred at this time. Assessment/Plan: Impression: Diarrhea causing bowel distention as seen on CT scan of the abdomen. Doubt acute surgical process, bowel obstruction at this time. Plan: Will have [facility] admit patient for treatment of diarrhea. Will follow with you.  Kailo Kosik A 10/14/2013, 9:54 AM

## 2013-10-14 NOTE — ED Notes (Signed)
Lab called and said that pt's CMP and Lipase should be released soon.

## 2013-10-14 NOTE — H&P (Signed)
[facility] History and Physical  [name]:[id number] DOB: 06-15-1941 DOA: 10/14/2013  Referring physician:  PCP: Monico Blitz, MD  Specialists:   Chief Complaint: abdominal pain, nausea, vomiting diarrhea   HPI: Lawrence Reynolds is a 73 y.o. male presented with nausea, vomiting, diarrhea and diffuse abdominal pain; He started having these symptoms after eating chicken few days ago, his wife had similar symptoms, resolved in 2-3 days; he denies hematochezia, no hematemesis, no fever, had some chills;   Review of Systems: The patient denies anorexia, fever, weight loss,, vision loss, decreased hearing, hoarseness, chest pain, syncope, dyspnea on exertion, peripheral edema, balance deficits, hemoptysis, hematochezia, severe indigestion/heartburn, hematuria, incontinence, genital sores, muscle weakness, suspicious skin lesions, transient blindness, difficulty walking, depression, unusual weight change, abnormal bleeding, enlarged lymph nodes, angioedema, and breast masses.    Past Medical History  Diagnosis Date  . Diverticulitis    Past Surgical History  Procedure Laterality Date  . Hernia repair    . Joint replacement    . Cholecystectomy    . Back surgery    . Total knee arthroplasty    . Small intestine surgery     Social History:  reports that he has never smoked. He does not have any smokeless tobacco history on file. He reports that he does not drink alcohol or use illicit drugs. Home;  where does patient live--home, ALF, SNF? and with whom if at home? Ys;  Can patient participate in ADLs?  No Known Allergies  No family history on file. denies h/o CAD (be sure to complete)  Prior to Admission medications   Medication Sig Start Date End Date Taking? Authorizing Provider  aspirin EC 81 MG tablet Take 81 mg by mouth daily.    Historical Provider, MD  diphenhydrAMINE (BENADRYL) 25 MG tablet Take 1 tablet (25 mg total) by mouth every 6 (six) hours as needed for  itching or allergies. 04/06/13   Nimish Luther Parody, MD  EPINEPHrine (EPIPEN) 0.3 mg/0.3 mL SOAJ injection Inject 1 mL (1 mg total) into the muscle once. 04/06/13   Doree Albee, MD  Multiple Vitamin (MULTIVITAMIN WITH MINERALS) TABS Take 1 tablet by mouth daily.    Historical Provider, MD  predniSONE (DELTASONE) 20 MG tablet Take 2 tablets immediately when you are having an allergic reaction. Then take 2 tablets daily for 5 days. 04/06/13   Nimish Luther Parody, MD  primidone (MYSOLINE) 50 MG tablet Take 100 mg by mouth 2 (two) times daily.     Historical Provider, MD  Tamsulosin HCl (FLOMAX) 0.4 MG CAPS Take 0.4 mg by mouth every evening.     Historical Provider, MD  triamcinolone cream (KENALOG) 0.1 % Apply 1 application topically daily as needed. 03/07/13   Historical Provider, MD  VITAMIN E PO Take 1 tablet by mouth daily.    Historical Provider, MD   Physical Exam: Filed Vitals:   10/14/13 0918  BP: 120/63  Pulse: 82  Temp: 97.9 F (36.6 C)  Resp: 14     General:  alert  Eyes: eom-i  ENT: no oral ulcers   Neck: supple   Cardiovascular: s1,s2 rrr  Respiratory: CTA BL  Abdomen: soft, distended, midl tender, no rebound   Skin: no rash  Musculoskeletal: no LE edema  Psychiatric: no hallucinations   Neurologic: CN 2-12 intact, motor 5/5 BL  Labs on Admission:  Basic Metabolic Panel:  Recent Labs Lab 10/14/13 0558  NA 138  K 3.5*  CL 99  CO2 25  GLUCOSE 139*  BUN 24*  CREATININE 0.95  CALCIUM 8.9   Liver Function Tests:  Recent Labs Lab 10/14/13 0558  AST 42*  ALT 69*  ALKPHOS 65  BILITOT 0.7  PROT 7.5  ALBUMIN 3.8    Recent Labs Lab 10/14/13 0558  LIPASE 32   No results found for this basename: AMMONIA,  in the last 168 hours CBC:  Recent Labs Lab 10/14/13 0558  WBC 14.5*  NEUTROABS 10.7*  HGB 15.4  HCT 46.0  MCV 93.5  PLT 252   Cardiac Enzymes: No results found for this basename: CKTOTAL, CKMB, CKMBINDEX, TROPONINI,  in the last 168  hours  BNP (last 3 results) No results found for this basename: PROBNP,  in the last 8760 hours CBG: No results found for this basename: GLUCAP,  in the last 168 hours  Radiological Exams on Admission: Ct Abdomen Pelvis W Contrast  10/14/2013   CLINICAL DATA:  Abdominal pain  EXAM: CT ABDOMEN AND PELVIS WITH CONTRAST  TECHNIQUE: Multidetector CT imaging of the abdomen and pelvis was performed using the standard protocol following bolus administration of intravenous contrast.  CONTRAST:  130mL OMNIPAQUE IOHEXOL 300 MG/ML SOLN, 6mL OMNIPAQUE IOHEXOL 300 MG/ML SOLN  COMPARISON:  06/23/2012  FINDINGS: The lung bases are free of acute infiltrate or sizable effusion. The liver is diffusely fatty infiltrated. The gallbladder has been surgically removed. The spleen, adrenal glands and pancreas are unremarkable. The kidneys are well visualized bilaterally. Cystic lesions are identified bilaterally as well as some scarring. The dominant cyst on the right has increased in size and now measures 2.3 cm in greatest dimension.  Mild small bowel dilatation is identified. No definitive transition zone is identified although some of the more distal jejunum/proximal ileal loops superior associated with the anterior abdominal wall in these changes may be related to underlying adhesions. No free air is seen. No free fluid is noted. Postsurgical changes are seen. The appendix is not visualized and may be surgically removed. No inflammatory changes are noted.  The bladder is decompressed. No pelvic mass lesion is noted. Degenerative changes of lumbar spine are noted.  IMPRESSION: Small bowel dilatation within the jejunum without definitive transition zone. There are changes suggestive of the adhesions likely related to prior surgery.  Chronic changes as described above.   Electronically Signed   By: Inez Catalina M.D.   On: 10/14/2013 08:26    EKG: Independently reviewed. Not done  Assessment/Plan Principal Problem:    Gastroenteritis Active Problems:   HYPERLIPIDEMIA   ANXIETY   ABDOMINAL PAIN, LOWER   Leukocytosis   Diarrhea   Nausea & vomiting  73 y.o. male presented with nausea, vomiting, diarrhea and diffuse abdominal pain after eating chicken few days ago  1. Probable gastroenteritis; +sick contacts, exposure;  -CT: Small bowel dilatation within the jejunum without definitive transition zone -start empiric IV atx, IVF, antiemetics, stool tests  2. Leukocytosis likely due to #1; cont as above   3. BPH cont flomax    Surgery;  if consultant consulted, please document name and whether formally or informally consulted  Code Status: full (must indicate code status--if unknown or must be presumed, indicate so) Family Communication: d/w patient, his wife (indicate person spoken with, if applicable, with phone number if by telephone) Disposition Plan: home 24-48 hours  (indicate anticipated LOS)  Time spent >35 minutes   Kinnie Feil [facility] Pager [phone]  If 7PM-7AM, please contact night-coverage [email or web address] Password [facility] 10/14/2013, 10:28 AM

## 2013-10-15 DIAGNOSIS — R112 Nausea with vomiting, unspecified: Secondary | ICD-10-CM

## 2013-10-15 LAB — BASIC METABOLIC PANEL
BUN: 16 mg/dL (ref 6–23)
CHLORIDE: 103 meq/L (ref 96–112)
CO2: 27 mEq/L (ref 19–32)
Calcium: 8.2 mg/dL — ABNORMAL LOW (ref 8.4–10.5)
Creatinine, Ser: 0.88 mg/dL (ref 0.50–1.35)
GFR calc Af Amer: >90 mL/min (ref >90)
GFR, EST NON AFRICAN AMERICAN: 84 mL/min — AB (ref >90)
Glucose, Bld: 102 mg/dL — ABNORMAL HIGH (ref 70–99)
POTASSIUM: 3.5 meq/L — AB (ref 3.7–5.3)
Sodium: 140 mEq/L (ref 137–147)

## 2013-10-15 LAB — CBC
HCT: 43.5 % (ref 39.0–52.0)
Hemoglobin: 14.4 g/dL (ref 13.0–17.0)
MCH: 31.3 pg (ref 26.0–34.0)
MCHC: 33.1 g/dL (ref 30.0–36.0)
MCV: 94.6 fL (ref 78.0–100.0)
PLATELETS: 257 10*3/uL (ref 150–400)
RBC: 4.6 MIL/uL (ref 4.22–5.81)
RDW: 13.6 % (ref 11.5–15.5)
WBC: 16 10*3/uL — AB (ref 4.0–10.5)

## 2013-10-15 LAB — CLOSTRIDIUM DIFFICILE BY PCR: CDIFFPCR: NEGATIVE

## 2013-10-15 NOTE — Progress Notes (Signed)
[facility] PROGRESS NOTE  Lawrence Reynolds OEV:[id number] DOB: May 16, 1941 DOA: 10/14/2013 PCP: Monico Blitz, MD  Assessment/Plan: Principal Problem:  Gastroenteritis  Active Problems:  HYPERLIPIDEMIA  ANXIETY  ABDOMINAL PAIN, LOWER  Leukocytosis  Diarrhea  Nausea & vomiting   73 y.o. male presented with nausea, vomiting, diarrhea and diffuse abdominal pain, nausea, vomiting, diarrhea after eating chicken few days ago   1. Probable gastroenteritis; +sick contacts, exposure;  -CT: Mild small bowel dilatation is identified; Small bowel dilatation within the jejunum without definitive transition zone  -started empiric IV atx, IVF, antiemetics, stool tests pend; diet if tolerated   2. Leukocytosis likely due to #1; cont as above  3. BPH cont flomax   Surgery; if consultant consulted, please document name and whether formally or informally consulted   Code Status: full Family Communication: d/w patient, wife yesterday (indicate person spoken with, relationship, and if by phone, the number) Disposition Plan: home when clinically better   Consultants:  surgery  Procedures:  None   Antibiotics:  cipro 3/15<<<<  Flagyl 3/15<<<<   (indicate start date, and stop date if known)  HPI/Subjective: alert  Objective: Filed Vitals:   10/15/13 0529  BP: 100/43  Pulse: 76  Temp: 98.6 F (37 C)  Resp: 20    Intake/Output Summary (Last 24 hours) at 10/15/13 1109 Last data filed at 10/14/13 1800  Gross per 24 hour  Intake      0 ml  Output      0 ml  Net      0 ml   Filed Weights   10/14/13 1259 10/14/13 1302 10/14/13 1958  Weight: 122.154 kg (269 lb 4.8 oz) 122.103 kg (269 lb 3 oz) 125.4 kg (276 lb 7.3 oz)    Exam:   General:  alert  Cardiovascular: s1,s2 rrr  Respiratory: CTA BL  Abdomen: soft, distended, mild tender, no rebound   Musculoskeletal: no LE edema   Data Reviewed: Basic Metabolic Panel:  Recent Labs Lab 10/14/13 0558  10/15/13 0509  NA 138 140  K 3.5* 3.5*  CL 99 103  CO2 25 27  GLUCOSE 139* 102*  BUN 24* 16  CREATININE 0.95 0.88  CALCIUM 8.9 8.2*   Liver Function Tests:  Recent Labs Lab 10/14/13 0558  AST 42*  ALT 69*  ALKPHOS 65  BILITOT 0.7  PROT 7.5  ALBUMIN 3.8    Recent Labs Lab 10/14/13 0558  LIPASE 32   No results found for this basename: AMMONIA,  in the last 168 hours CBC:  Recent Labs Lab 10/14/13 0558 10/15/13 0509  WBC 14.5* 16.0*  NEUTROABS 10.7*  --   HGB 15.4 14.4  HCT 46.0 43.5  MCV 93.5 94.6  PLT 252 257   Cardiac Enzymes: No results found for this basename: CKTOTAL, CKMB, CKMBINDEX, TROPONINI,  in the last 168 hours BNP (last 3 results) No results found for this basename: PROBNP,  in the last 8760 hours CBG: No results found for this basename: GLUCAP,  in the last 168 hours  Recent Results (from the past 240 hour(s))  CLOSTRIDIUM DIFFICILE BY PCR     Status: None   Collection Time    10/15/13  3:10 AM      Result Value Ref Range Status   C difficile by pcr NEGATIVE  NEGATIVE Final     Studies: Ct Abdomen Pelvis W Contrast  10/14/2013   CLINICAL DATA:  Abdominal pain  EXAM: CT ABDOMEN AND PELVIS WITH CONTRAST  TECHNIQUE: Multidetector CT imaging of  the abdomen and pelvis was performed using the standard protocol following bolus administration of intravenous contrast.  CONTRAST:  175mL OMNIPAQUE IOHEXOL 300 MG/ML SOLN, 65mL OMNIPAQUE IOHEXOL 300 MG/ML SOLN  COMPARISON:  06/23/2012  FINDINGS: The lung bases are free of acute infiltrate or sizable effusion. The liver is diffusely fatty infiltrated. The gallbladder has been surgically removed. The spleen, adrenal glands and pancreas are unremarkable. The kidneys are well visualized bilaterally. Cystic lesions are identified bilaterally as well as some scarring. The dominant cyst on the right has increased in size and now measures 2.3 cm in greatest dimension.  Mild small bowel dilatation is identified. No  definitive transition zone is identified although some of the more distal jejunum/proximal ileal loops superior associated with the anterior abdominal wall in these changes may be related to underlying adhesions. No free air is seen. No free fluid is noted. Postsurgical changes are seen. The appendix is not visualized and may be surgically removed. No inflammatory changes are noted.  The bladder is decompressed. No pelvic mass lesion is noted. Degenerative changes of lumbar spine are noted.  IMPRESSION: Small bowel dilatation within the jejunum without definitive transition zone. There are changes suggestive of the adhesions likely related to prior surgery.  Chronic changes as described above.   Electronically Signed   By: Inez Catalina M.D.   On: 10/14/2013 08:26    Scheduled Meds: . aspirin EC  81 mg Oral Daily  . ciprofloxacin  400 mg Intravenous Q12H  . enoxaparin (LOVENOX) injection  60 mg Subcutaneous Daily  . metronidazole  500 mg Intravenous Q8H  . primidone  100 mg Oral BID  . tamsulosin  0.4 mg Oral QPM   Continuous Infusions: . 0.9 % NaCl with KCl 20 mEq / L 100 mL/hr at 10/15/13 9892    Principal Problem:   Gastroenteritis Active Problems:   HYPERLIPIDEMIA   ANXIETY   ABDOMINAL PAIN, LOWER   Leukocytosis   Diarrhea   Nausea & vomiting   Abdominal pain    Time spent: >35 minutes     Kinnie Feil  [facility] Pager [phone]. If 7PM-7AM, please contact night-coverage at [email or web address], password [facility] 10/15/2013, 11:09 AM  LOS: 1 day

## 2013-10-15 NOTE — Care Management Note (Addendum)
    Page 1 of 1   10/16/2013     11:21:13 AM   CARE MANAGEMENT NOTE 10/16/2013  Patient:  Lawrence Reynolds, Lawrence Reynolds   Account Number:  [id number]  Date Initiated:  10/15/2013  Documentation initiated by:  Theophilus Kinds  Subjective/Objective Assessment:   Pt admitted from home with gastroenteritis. Pt lives with his wife and will return home at discharge. Pt is independent with ADL's.     Action/Plan:   No CM needs noted.   Anticipated DC Date:  10/16/2013   Anticipated DC Plan:  [place]  CM consult      Choice offered to / List presented to:             Status of service:  Completed, signed off Medicare Important Message given?  NA - LOS <3 / Initial given by admissions (If response is "NO", the following Medicare IM given date fields will be blank) Date Medicare IM given:   Date Additional Medicare IM given:    Discharge Disposition:  HOME/SELF CARE  Per UR Regulation:    If discussed at Long Length of Stay Meetings, dates discussed:    Comments:  10/16/13 [place], RN BSN CM Pt discharged home today. No CM needs noted.  10/15/13 [place], RN BSN CM

## 2013-10-15 NOTE — Progress Notes (Signed)
UR completed. Patient changed to inpatient- requiring IV antibiotics and IV flagyl and IVF

## 2013-10-16 ENCOUNTER — Inpatient Hospital Stay (HOSPITAL_COMMUNITY): Payer: Medicare Other

## 2013-10-16 DIAGNOSIS — D72829 Elevated white blood cell count, unspecified: Secondary | ICD-10-CM

## 2013-10-16 LAB — CBC
HCT: 40.5 % (ref 39.0–52.0)
HEMOGLOBIN: 13.6 g/dL (ref 13.0–17.0)
MCH: 31.7 pg (ref 26.0–34.0)
MCHC: 33.6 g/dL (ref 30.0–36.0)
MCV: 94.4 fL (ref 78.0–100.0)
Platelets: 228 10*3/uL (ref 150–400)
RBC: 4.29 MIL/uL (ref 4.22–5.81)
RDW: 13.3 % (ref 11.5–15.5)
WBC: 10.3 10*3/uL (ref 4.0–10.5)

## 2013-10-16 LAB — GI PATHOGEN PANEL BY PCR, STOOL
C difficile toxin A/B: NEGATIVE
CAMPYLOBACTER BY PCR: NEGATIVE
CRYPTOSPORIDIUM BY PCR: NEGATIVE
E coli (ETEC) LT/ST: NEGATIVE
E coli (STEC): NEGATIVE
E coli 0157 by PCR: NEGATIVE
G lamblia by PCR: NEGATIVE
NOROVIRUS G1/G2: POSITIVE
ROTAVIRUS A BY PCR: NEGATIVE
SHIGELLA BY PCR: NEGATIVE
Salmonella by PCR: NEGATIVE

## 2013-10-16 MED ORDER — CIPROFLOXACIN HCL 500 MG PO TABS: 500.0000 mg | ORAL_TABLET | Freq: Two times a day (BID) | ORAL | Status: AC

## 2013-10-16 MED ORDER — METRONIDAZOLE 500 MG PO TABS: 500.0000 mg | ORAL_TABLET | Freq: Three times a day (TID) | ORAL | Status: DC

## 2013-10-16 NOTE — Discharge Summary (Signed)
Patient seen and examined this morning. Symptoms improved. No further nausea vomiting and diarrhea. Although followup abdominal x-ray comments on probable persistent partial small bowel obstruction, patient does not have further symptoms or findings on physical exam ( abdominal exam benign). Stool for C. difficile negative. Follow GI pathogen panel as outpatient. Agree with a course of empiric antibiotic. Agree with discharge plans outlined above.

## 2013-10-16 NOTE — Progress Notes (Signed)
Pt discharged home today per NP Dyanne Carrel. Pt's IV site D/C'd and WNL. Pt's VS stable at this time. Pt provided with home medication list, discharge instructions and prescriptions. Verbalized understanding. Pt left floor in stable condition accompanied by NT.

## 2013-10-16 NOTE — Discharge Summary (Signed)
Physician Discharge Summary  DAWAUN Reynolds TDV:[id number] DOB: [birth year] DOA: 10/14/2013  PCP: Monico Blitz, MD  Admit date: 10/14/2013 Discharge date: 10/16/2013  Time spent: 40 minutes  Recommendations for Outpatient Follow-up:  1.PCP in 1-2 weeks for evaluation of symptoms and follow GI pathogen results.   Discharge Diagnoses:  Principal Problem:   Gastroenteritis Active Problems:   HYPERLIPIDEMIA   ANXIETY   ABDOMINAL PAIN, LOWER   Leukocytosis   Diarrhea   Nausea & vomiting   Abdominal pain   Discharge Condition: stable  Diet recommendation: regular  Filed Weights   10/14/13 1259 10/14/13 1302 10/14/13 1958  Weight: 122.154 kg (269 lb 4.8 oz) 122.103 kg (269 lb 3 oz) 125.4 kg (276 lb 7.3 oz)    History of present illness:  Lawrence Reynolds is a 73 y.o. male presented on 10/14/13 with nausea, vomiting, diarrhea and diffuse abdominal pain; He started having these symptoms after eating chicken few days prior, his wife had similar symptoms, resolved in 2-3 days; he denied hematochezia, no hematemesis, no fever, had some chills   Hospital Course:  1. Probable gastroenteritis; +sick contacts, exposure;  -CT: Mild small bowel dilatation is identified; Small bowel dilatation within the jejunum without definitive transition zone. Provided with bowel rest and cipro and flagyl and improved quickly. At discharge pain resolved and tolerating regular diet. He had 1 soft BM day of discharge. Repeat abdominal xray with probable persistent partial small bowel obstruction.  cdiff negative. GI pathogen panel pending and will need to be followed by PCP. Continue cipro and flagyl for 5 more days for total 7 days. Follow up with PCP 1-2 weeks for evaluation of symptoms and follow GI pathogen results  2. Leukocytosis likely due to #1; resolved at discharge. Afebrile and non-toxic appearing.   3. BPH cont flomax    Procedures:    Consultations:  none  Discharge Exam: Filed Vitals:    10/16/13 0554  BP: 98/59  Pulse: 71  Temp: 98.2 F (36.8 C)  Resp: 18    General: obese appears comfortable Cardiovascular: RRR No MGR No LE edema Respiratory: normal effort BS clear bilaterally no wheeze no rhonchi Abdomen: obese soft +BS mild diffuse tenderness to palpation.   Discharge Instructions  Discharge Orders   Future Orders Complete By Expires   Diet - low sodium heart healthy  As directed    Discharge instructions  As directed    Comments:     Take medication as directed Advance diet as tolerated Follow up with PCP 1-2 week for evaluation of symptoms and follow GI pathogen results If symptoms fail to continue to improve seek medical attention   Increase activity slowly  As directed        Medication List         aspirin EC 81 MG tablet  Take 81 mg by mouth daily.     ciprofloxacin 500 MG tablet  Commonly known as:  CIPRO  Take 1 tablet (500 mg total) by mouth 2 (two) times daily.     EPINEPHrine 0.3 mg/0.3 mL Soaj injection  Commonly known as:  EPIPEN  Inject 1 mL (1 mg total) into the muscle once.     metroNIDAZOLE 500 MG tablet  Commonly known as:  FLAGYL  Take 1 tablet (500 mg total) by mouth 3 (three) times daily.     multivitamin with minerals Tabs tablet  Take 1 tablet by mouth daily.     primidone 50 MG tablet  Commonly known as:  MYSOLINE  Take 100 mg by mouth 2 (two) times daily.     tamsulosin 0.4 MG Caps capsule  Commonly known as:  FLOMAX  Take 0.4 mg by mouth every evening.     triamcinolone cream 0.1 %  Commonly known as:  KENALOG  Apply 1 application topically daily as needed.     VITAMIN E PO  Take 1 tablet by mouth daily.       No Known Allergies     Follow-up Information   Follow up with [facility], [place]. Schedule an appointment as soon as possible for a visit in 1 week. (for evaluation of symptoms and follow GI pathogen results. )    Specialty:  Internal Medicine   Contact information:   [place]   [id number] [phone]        The results of significant diagnostics from this hospitalization (including imaging, microbiology, ancillary and laboratory) are listed below for reference.    Significant Diagnostic Studies: Ct Abdomen Pelvis W Contrast  10/14/2013   CLINICAL DATA:  Abdominal pain  EXAM: CT ABDOMEN AND PELVIS WITH CONTRAST  TECHNIQUE: Multidetector CT imaging of the abdomen and pelvis was performed using the standard protocol following bolus administration of intravenous contrast.  CONTRAST:  164mL OMNIPAQUE IOHEXOL 300 MG/ML SOLN, 29mL OMNIPAQUE IOHEXOL 300 MG/ML SOLN  COMPARISON:  06/23/2012  FINDINGS: The lung bases are free of acute infiltrate or sizable effusion. The liver is diffusely fatty infiltrated. The gallbladder has been surgically removed. The spleen, adrenal glands and pancreas are unremarkable. The kidneys are well visualized bilaterally. Cystic lesions are identified bilaterally as well as some scarring. The dominant cyst on the right has increased in size and now measures 2.3 cm in greatest dimension.  Mild small bowel dilatation is identified. No definitive transition zone is identified although some of the more distal jejunum/proximal ileal loops superior associated with the anterior abdominal wall in these changes may be related to underlying adhesions. No free air is seen. No free fluid is noted. Postsurgical changes are seen. The appendix is not visualized and may be surgically removed. No inflammatory changes are noted.  The bladder is decompressed. No pelvic mass lesion is noted. Degenerative changes of lumbar spine are noted.  IMPRESSION: Small bowel dilatation within the jejunum without definitive transition zone. There are changes suggestive of the adhesions likely related to prior surgery.  Chronic changes as described above.   Electronically Signed   By: Inez Catalina M.D.   On: 10/14/2013 08:26   Dg Abd Portable 1v  10/16/2013   CLINICAL DATA:  Abdominal pain,  follow-up of partial small bowel obstruction  EXAM: PORTABLE ABDOMEN - 1 VIEW  COMPARISON:  CT abdomen pelvis of 10/14/2013  FINDINGS: There is still some gaseous distention of multiple small bowel loops on this supine portable film consistent with persistent partial small bowel obstruction. Some contrast is noted in the region of the rectum. There are degenerative changes throughout the lumbar spine.  IMPRESSION: Probable persistent partial small bowel obstruction.   Electronically Signed   By: Ivar Drape M.D.   On: 10/16/2013 08:43    Microbiology: Recent Results (from the past 240 hour(s))  CLOSTRIDIUM DIFFICILE BY PCR     Status: None   Collection Time    10/15/13  3:10 AM      Result Value Ref Range Status   C difficile by pcr NEGATIVE  NEGATIVE Final     Labs: Basic Metabolic Panel:  Recent Labs Lab 10/14/13 0558 10/15/13  0509  NA 138 140  K 3.5* 3.5*  CL 99 103  CO2 25 27  GLUCOSE 139* 102*  BUN 24* 16  CREATININE 0.95 0.88  CALCIUM 8.9 8.2*   Liver Function Tests:  Recent Labs Lab 10/14/13 0558  AST 42*  ALT 69*  ALKPHOS 65  BILITOT 0.7  PROT 7.5  ALBUMIN 3.8    Recent Labs Lab 10/14/13 0558  LIPASE 32   No results found for this basename: AMMONIA,  in the last 168 hours CBC:  Recent Labs Lab 10/14/13 0558 10/15/13 0509 10/16/13 0945  WBC 14.5* 16.0* 10.3  NEUTROABS 10.7*  --   --   HGB 15.4 14.4 13.6  HCT 46.0 43.5 40.5  MCV 93.5 94.6 94.4  PLT 252 257 228   Cardiac Enzymes: No results found for this basename: CKTOTAL, CKMB, CKMBINDEX, TROPONINI,  in the last 168 hours BNP: BNP (last 3 results) No results found for this basename: PROBNP,  in the last 8760 hours CBG: No results found for this basename: GLUCAP,  in the last 168 hours     Signed:  Radene Gunning  [facility] 10/16/2013, 10:29 AM

## 2013-10-16 NOTE — Progress Notes (Signed)
[facility] PROGRESS NOTE  OSHEN WLODARCZYK GLO:[id number] DOB: [birth year] DOA: 10/14/2013 PCP: Monico Blitz, MD  Assessment/Plan: 1. Probable gastroenteritis; +sick contacts, exposure;  -CT: Mild small bowel dilatation is identified; Small bowel dilatation within the jejunum without definitive transition zone  -much improved this am. Requesting food. Denies pain. 1 soft BM this am. cdiff negative. GI pathogen panel pending. Check follow up abdominal xray -continue empiric IV atx, IVF, antiemetics, stool tests pend; advance diet  2. Leukocytosis likely due to #1; await today's labs. Afebrile and non-toxic appearing.  cont as above  3. BPH cont flomax   Code Status: full Family Communication: wife at bedside Disposition Plan: home hopefully this afternoon or in am   Consultants:  none  Procedures:  none  Antibiotics: cipro 3/15<<<<  Flagyl 3/15<<<<   HPI/Subjective: Lying in bed denies pain/discomfort. Reports 1 episode diarrhea. Requesting food.   Objective: Filed Vitals:   10/16/13 0554  BP: 98/59  Pulse: 71  Temp: 98.2 F (36.8 C)  Resp: 18    Intake/Output Summary (Last 24 hours) at 10/16/13 0847 Last data filed at 10/16/13 1884  Gross per 24 hour  Intake   2180 ml  Output      0 ml  Net   2180 ml   Filed Weights   10/14/13 1259 10/14/13 1302 10/14/13 1958  Weight: 122.154 kg (269 lb 4.8 oz) 122.103 kg (269 lb 3 oz) 125.4 kg (276 lb 7.3 oz)    Exam:   General:  Obese appears comfortable NAD  Cardiovascular: RRR No MGR No LE edema  Respiratory: normal effort BS clear bilaterally no wheeze  Abdomen: obese soft +BS but sluggish RLQ otherwise normal. Mild diffuse tenderness to palpation. No guarding  Musculoskeletal: no clubbing no cyanosis   Data Reviewed: Basic Metabolic Panel:  Recent Labs Lab 10/14/13 0558 10/15/13 0509  NA 138 140  K 3.5* 3.5*  CL 99 103  CO2 25 27  GLUCOSE 139* 102*  BUN 24* 16  CREATININE 0.95 0.88  CALCIUM  8.9 8.2*   Liver Function Tests:  Recent Labs Lab 10/14/13 0558  AST 42*  ALT 69*  ALKPHOS 65  BILITOT 0.7  PROT 7.5  ALBUMIN 3.8    Recent Labs Lab 10/14/13 0558  LIPASE 32   No results found for this basename: AMMONIA,  in the last 168 hours CBC:  Recent Labs Lab 10/14/13 0558 10/15/13 0509  WBC 14.5* 16.0*  NEUTROABS 10.7*  --   HGB 15.4 14.4  HCT 46.0 43.5  MCV 93.5 94.6  PLT 252 257   Cardiac Enzymes: No results found for this basename: CKTOTAL, CKMB, CKMBINDEX, TROPONINI,  in the last 168 hours BNP (last 3 results) No results found for this basename: PROBNP,  in the last 8760 hours CBG: No results found for this basename: GLUCAP,  in the last 168 hours  Recent Results (from the past 240 hour(s))  CLOSTRIDIUM DIFFICILE BY PCR     Status: None   Collection Time    10/15/13  3:10 AM      Result Value Ref Range Status   C difficile by pcr NEGATIVE  NEGATIVE Final     Studies: Dg Abd Portable 1v  10/16/2013   CLINICAL DATA:  Abdominal pain, follow-up of partial small bowel obstruction  EXAM: PORTABLE ABDOMEN - 1 VIEW  COMPARISON:  CT abdomen pelvis of 10/14/2013  FINDINGS: There is still some gaseous distention of multiple small bowel loops on this supine portable film consistent with persistent  partial small bowel obstruction. Some contrast is noted in the region of the rectum. There are degenerative changes throughout the lumbar spine.  IMPRESSION: Probable persistent partial small bowel obstruction.   Electronically Signed   By: Ivar Drape M.D.   On: 10/16/2013 08:43    Scheduled Meds: . aspirin EC  81 mg Oral Daily  . ciprofloxacin  400 mg Intravenous Q12H  . enoxaparin (LOVENOX) injection  60 mg Subcutaneous Daily  . metronidazole  500 mg Intravenous Q8H  . primidone  100 mg Oral BID  . tamsulosin  0.4 mg Oral QPM   Continuous Infusions: . 0.9 % NaCl with KCl 20 mEq / L 100 mL/hr at 10/15/13 2109    Principal Problem:   Gastroenteritis Active  Problems:   HYPERLIPIDEMIA   ANXIETY   ABDOMINAL PAIN, LOWER   Leukocytosis   Diarrhea   Nausea & vomiting   Abdominal pain    Time spent: 35 minutes    [facility] Pager [phone]. If 7PM-7AM, please contact night-coverage at [email or web address], password [facility] 10/16/2013, 8:47 AM  LOS: 2 days

## 2014-02-06 ENCOUNTER — Telehealth: Payer: Self-pay | Admitting: Neurology

## 2014-02-06 ENCOUNTER — Telehealth: Payer: Self-pay | Admitting: *Deleted

## 2014-02-06 ENCOUNTER — Encounter: Payer: Self-pay | Admitting: Neurology

## 2014-02-06 ENCOUNTER — Other Ambulatory Visit: Payer: Self-pay | Admitting: *Deleted

## 2014-02-06 ENCOUNTER — Ambulatory Visit (INDEPENDENT_AMBULATORY_CARE_PROVIDER_SITE_OTHER): Payer: Medicare Other | Admitting: Neurology

## 2014-02-06 VITALS — BP 130/78 | HR 100 | Temp 98.0°F | Resp 22 | Ht 68.0 in | Wt 278.8 lb

## 2014-02-06 DIAGNOSIS — G44029 Chronic cluster headache, not intractable: Secondary | ICD-10-CM

## 2014-02-06 DIAGNOSIS — G44021 Chronic cluster headache, intractable: Secondary | ICD-10-CM

## 2014-02-06 MED ORDER — VERAPAMIL HCL 80 MG PO TABS: 80.0000 mg | ORAL_TABLET | Freq: Three times a day (TID) | ORAL | Status: DC

## 2014-02-06 MED ORDER — KETOROLAC TROMETHAMINE 60 MG/2ML IM SOLN
60.0000 mg | Freq: Once | INTRAMUSCULAR | Status: AC
  Administered 2014-02-06: 60 mg via INTRAMUSCULAR

## 2014-02-06 MED ORDER — PREDNISONE 10 MG PO TABS: ORAL_TABLET | ORAL | Status: DC

## 2014-02-06 NOTE — Telephone Encounter (Signed)
Verapamil 80 mg #90 e scribed to pharmacy with 1 refill  Patient wife is aware and was advised to call office in 2 weeks to let us know how patient is doing .

## 2014-02-06 NOTE — Progress Notes (Signed)
NEUROLOGY CONSULTATION NOTE  Lawrence Reynolds MRN: [id number] DOB: 15-Feb-1941  Referring provider: Dr. Manuella Ghazi Primary care provider: Dr. Manuella Ghazi  Reason for consult:  headache  HISTORY OF PRESENT ILLNESS: Lawrence Reynolds is a 73 year old right-handed man with history of sleep apnea, BPH, tremor and hyperlipidemia who presents for headache.  Onset:  Started about 2 years ago.  Initially it occurred twice a year for 2-3 weeks, however it has been ongoing since December 2014-January 2015.  His chart shows prior imaging going back since 2005 for headache as well. Location:  left-sided, involving the eye and ear Quality:  pounding Intensity:  "12/10" Aura:  no Prodrome:  no Associated symptoms:  Left eye lacrimation, left nasal rhinorrhea, left sided facial swelling.  No nausea, photophobia, phonophobia or visual disturbance. Duration:  Usually 60-90 minutes Frequency:  At least a couple of times a day.  Usually occurs for 2-3 weeks during fall and spring.  This time, it has been ongoing for about 6 months. Triggers/exacerbating factors:  Laying down Relieving factors:  Getting up.  Ice pack Activity:  Cannot function.  Past abortive therapy:  BC powder, excedrin, ibuprofen, ASA (was taking daily) Past preventative therapy:  None.  Current abortive therapy:  hydrocodone-acetaminophen 5-325mg  (started yesterday), Zanaflex 4mg  (started yesterday) Current preventative therapy:  gabapentin 300mg  at bedtime. Other medications:  Primidone 100mg  twice daily, Flomax  Caffeine:  Coffee, tea Alcohol:  no Smoker:  Ex-smoker Diet:  Could be better Exercise:  Used to go for walks, but not recently due to the pain Depression/stress:  denies Sleep hygiene:  Poor, due to pain Family history of headache:  no  CT of the head was performed on 01/18/14.  I do not have the scans but report says it was a "negative noncontrast head CT".  He did have an MRI of the brain with and without contrast and MRA  of the neck on 01/05/08 for headache, both unremarkable.  He has had several other CT scans of the head in the past for headache, going back as far as 2005 in the chart, all unremarkable.  EKG performed today was unremarkable (normal PR and QT)  PAST MEDICAL HISTORY: Past Medical History  Diagnosis Date  . Diverticulitis   . Headache(784.0)   . BPH (benign prostatic hypertrophy)     PAST SURGICAL HISTORY: Past Surgical History  Procedure Laterality Date  . Hernia repair    . Joint replacement    . Cholecystectomy    . Total knee arthroplasty    . Small intestine surgery      MEDICATIONS: Current Outpatient Prescriptions on File Prior to Visit  Medication Sig Dispense Refill  . aspirin EC 81 MG tablet Take 81 mg by mouth daily.      Marland Kitchen EPINEPHrine (EPIPEN) 0.3 mg/0.3 mL SOAJ injection Inject 1 mL (1 mg total) into the muscle once.  1 Device  6  . Multiple Vitamin (MULTIVITAMIN WITH MINERALS) TABS Take 1 tablet by mouth daily.      . primidone (MYSOLINE) 50 MG tablet Take 100 mg by mouth 2 (two) times daily.       . Tamsulosin HCl (FLOMAX) 0.4 MG CAPS Take 0.4 mg by mouth every evening.       . triamcinolone cream (KENALOG) 0.1 % Apply 1 application topically daily as needed.      Marland Kitchen VITAMIN E PO Take 1 tablet by mouth daily.      . metroNIDAZOLE (FLAGYL) 500 MG tablet Take 1 tablet (  500 mg total) by mouth 3 (three) times daily.  14 tablet  0   No current facility-administered medications on file prior to visit.    ALLERGIES: No Known Allergies  FAMILY HISTORY: Family History  Problem Relation Age of Onset  . Cancer Brother     lung  . Seizures Brother   . Dementia Brother     SOCIAL HISTORY: History   Social History  . Marital Status: Married    Spouse Name: N/A    Number of Children: N/A  . Years of Education: N/A   Occupational History  . Not on file.   Social History Main Topics  . Smoking status: Former Research scientist (life sciences)  . Smokeless tobacco: Not on file  . Alcohol  Use: No  . Drug Use: No  . Sexual Activity: No   Other Topics Concern  . Not on file   Social History Narrative  . No narrative on file    REVIEW OF SYSTEMS: Constitutional: No fevers, chills, or sweats, no generalized fatigue, change in appetite Eyes: No visual changes, double vision, eye pain Ear, nose and throat: No hearing loss, ear pain, nasal congestion, sore throat Cardiovascular: No chest pain, palpitations Respiratory:  Short of breath GastrointestinaI: No nausea, vomiting, diarrhea, abdominal pain, fecal incontinence Genitourinary:  No dysuria, urinary retention or frequency Musculoskeletal:  No neck pain, back pain Integumentary: No rash, pruritus, skin lesions Neurological: as above Psychiatric: No depression, insomnia, anxiety Endocrine: No palpitations, fatigue, diaphoresis, mood swings, change in appetite, change in weight, increased thirst Hematologic/Lymphatic:  No anemia, purpura, petechiae. Allergic/Immunologic: no itchy/runny eyes, nasal congestion, recent allergic reactions, rashes  PHYSICAL EXAM: Filed Vitals:   02/06/14 0750  BP: 130/78  Pulse: 100  Temp: 98 F (36.7 C)  Resp: 22   General: No acute distress Head:  Normocephalic/atraumatic Neck: supple, no paraspinal tenderness, full range of motion Back: No paraspinal tenderness Heart: regular rate and rhythm Lungs: Clear to auscultation bilaterally. Vascular: No carotid bruits. Neurological Exam: Mental status: alert and oriented to person, place, and time, recent and remote memory intact, fund of knowledge intact, attention and concentration intact, speech fluent and not dysarthric, language intact. Cranial nerves: CN I: not tested CN II: pupils equal, round and reactive to light, visual fields intact, fundi unremarkable, without vessel changes, exudates, hemorrhages or papilledema. CN III, IV, VI:  full range of motion, no nystagmus, no ptosis CN V: facial sensation intact CN VII: upper and  lower face symmetric CN VIII: hearing intact CN IX, X: gag intact, uvula midline CN XI: sternocleidomastoid and trapezius muscles intact CN XII: tongue midline Bulk & Tone: normal, no fasciculations. Motor: 5 out of 5 throughout Sensation: Temperature and vibration intact Deep Tendon Reflexes: 2+ throughout except absent in the ankles. Toes downgoing Finger to nose testing: No dysmetria Heel to shin: No dysmetria Gait: Normal station and stride. Able to turn. Able to walk in tandem although a little unsteady. Romberg negative.  IMPRESSION: Suspect cluster headaches  PLAN: 1. Will start verapamil 80mg  three times daily.  I want to repeat EKG 2 weeks after initiating the medication. 2. We'll set up for home 100% O2 7-15 L per minute for 15-20 minutes the nonrebreathing face mask. 3. Will prescribe prednisone taper. It should be started tomorrow since we're going to give him a Toradol injection today. 4.  Advised to stop BC powder and daily use of pain relievers. Limit use of hydrocodone for only severe attacks.  Stop caffeine. 5. Since he is short  of breath and his O2 sat is 89%, he will followup today with his PCP. His office was notified. 6. Followup in 6 weeks.  Thank you for allowing me to take part in the care of this patient.  Metta Clines, DO  CC:  Monico Blitz, MD

## 2014-02-06 NOTE — Telephone Encounter (Signed)
Message copied by Claudie Revering on Wed Feb 06, 2014  3:17 PM ------      Message from: JAFFE, ADAM R      Created: Wed Feb 06, 2014 12:21 PM       I would like to start verapamil 80mg  three times daily.  I would like to have him call and update [place] in 2 weeks.  If no change in headache, I want to repeat EKG and we can further titrate dose from there. ------

## 2014-02-06 NOTE — Patient Instructions (Addendum)
1.  You will need to be started on a daily medication.  I want to start you on verapamil.  We will get an EKG first. 2.  We will set you up for oxygen.  This can be taken when you get the headache.  Take for 15-20 minutes via non-breathing face mask. 3.  We will prescribe a prednisone taper to try and break the headache.  Take 6tabs x1day, then 5tabs x1day, then 4tabs x1day, then 3tabs x1day, then 2tabs x1day, then 1tab x1day, then STOP.  Start it tomorrow. 4.  Follow up in 6 weeks. 5.  We will get you MRI of the brain. 6.  Stop ibuprofen, BC powder and excedrin.  Take the hydrocodone SPARINGLY.  Do not take any pain relievers daily. 7.  See your PCP for your breathing. 8.  We will get MRI of the brain.

## 2014-02-13 ENCOUNTER — Telehealth: Payer: Self-pay | Admitting: *Deleted

## 2014-02-13 ENCOUNTER — Telehealth: Payer: Self-pay | Admitting: Neurology

## 2014-02-13 NOTE — Telephone Encounter (Signed)
I spoke with patients wife she states that the patients headaches are not any better I advise her sometimes with the new medication the headache can get worse before it finally begins to ease off . She was advised to call office back in 1 weeks as per the instructions Dr Tomi Likens had given the patient

## 2014-02-13 NOTE — Telephone Encounter (Signed)
Pt states e finished his medication and needs to talk to someone he is still having headaches please call back sometime after lunch today please [phone]

## 2014-02-14 ENCOUNTER — Telehealth: Payer: Self-pay | Admitting: Neurology

## 2014-02-14 ENCOUNTER — Telehealth: Payer: Self-pay | Admitting: *Deleted

## 2014-02-14 NOTE — Telephone Encounter (Signed)
Amy R. W/ [employer] is call with questions regarding O2 order for this patient. Please call back @ [phone] / Sherri S.

## 2014-02-14 NOTE — Telephone Encounter (Signed)
Spoke with patient to confirm they had picked up O2 and to see if it was helping with  The cluster headaches  . He states he has the O2 and he thinks it is helping . His wife states he was passing a lot of blood this am with his stool . I advised her to contact his PCP this could be something serious.

## 2014-02-14 NOTE — Telephone Encounter (Signed)
I left a message with Amy at [place] on her voicemail  02/14/14 400pm

## 2014-02-15 NOTE — Telephone Encounter (Signed)
Rena from [employer] called and said that it was approved

## 2014-03-18 ENCOUNTER — Telehealth: Payer: Self-pay | Admitting: *Deleted

## 2014-03-18 NOTE — Telephone Encounter (Signed)
Patient unable to keep appointment at this time he states he is unable to pay his copay

## 2014-03-20 ENCOUNTER — Ambulatory Visit: Payer: Medicare Other | Admitting: Neurology

## 2014-04-22 ENCOUNTER — Other Ambulatory Visit: Payer: Self-pay | Admitting: Neurology

## 2014-04-24 ENCOUNTER — Other Ambulatory Visit: Payer: Self-pay | Admitting: Neurology

## 2014-07-25 ENCOUNTER — Other Ambulatory Visit: Payer: Self-pay | Admitting: Neurology

## 2014-07-25 MED ORDER — VERAPAMIL HCL 80 MG PO TABS: 80.0000 mg | ORAL_TABLET | Freq: Three times a day (TID) | ORAL | Status: DC

## 2014-07-25 NOTE — Telephone Encounter (Signed)
Rx sent 

## 2014-09-10 NOTE — Telephone Encounter (Signed)
error 

## 2015-02-10 ENCOUNTER — Emergency Department (HOSPITAL_COMMUNITY): Payer: Medicare Other

## 2015-02-10 ENCOUNTER — Encounter (HOSPITAL_COMMUNITY): Payer: Self-pay | Admitting: Emergency Medicine

## 2015-02-10 ENCOUNTER — Emergency Department (HOSPITAL_COMMUNITY)
Admission: EM | Admit: 2015-02-10 | Discharge: 2015-02-10 | Disposition: A | Discharge status: Home / Self Care | Payer: Medicare Other | Attending: Emergency Medicine | Admitting: Emergency Medicine

## 2015-02-10 DIAGNOSIS — M545 Low back pain, unspecified: Secondary | ICD-10-CM

## 2015-02-10 DIAGNOSIS — Z87891 Personal history of nicotine dependence: Secondary | ICD-10-CM | POA: Insufficient documentation

## 2015-02-10 DIAGNOSIS — E65 Localized adiposity: Secondary | ICD-10-CM | POA: Insufficient documentation

## 2015-02-10 DIAGNOSIS — Z7982 Long term (current) use of aspirin: Secondary | ICD-10-CM | POA: Insufficient documentation

## 2015-02-10 DIAGNOSIS — Z79899 Other long term (current) drug therapy: Secondary | ICD-10-CM | POA: Insufficient documentation

## 2015-02-10 DIAGNOSIS — Z792 Long term (current) use of antibiotics: Secondary | ICD-10-CM | POA: Diagnosis not present

## 2015-02-10 DIAGNOSIS — N400 Enlarged prostate without lower urinary tract symptoms: Secondary | ICD-10-CM | POA: Insufficient documentation

## 2015-02-10 MED ORDER — HYDROCODONE-ACETAMINOPHEN 5-325 MG PO TABS
1.0000 | ORAL_TABLET | Freq: Once | ORAL | Status: AC
  Administered 2015-02-10: 1 via ORAL
  Filled 2015-02-10: qty 1

## 2015-02-10 MED ORDER — HYDROCODONE-ACETAMINOPHEN 5-325 MG PO TABS: 1.0000 | ORAL_TABLET | ORAL | Status: DC | PRN

## 2015-02-10 NOTE — ED Provider Notes (Signed)
CSN: [id number]     Arrival date & time 02/10/15  1038 History  This chart was scribed for non-physician practitioner Evalee Jefferson, PA-C working with Elnora Morrison, MD by Hilda Lias, ED Scribe. This patient was seen in room APFT24/APFT24 and the patient's care was started at 11:48 AM.    Chief Complaint  Patient presents with  . Back Pain     Patient is a 74 y.o. male presenting with back pain. The history is provided by the patient. No language interpreter was used.  Back Pain Location:  Lumbar spine Radiates to:  Does not radiate Pain severity:  Moderate Onset quality:  Gradual Duration:  2 weeks Chronicity:  New Relieved by:  Bed rest Worsened by:  Nothing tried Associated symptoms: no abdominal pain, no chest pain, no dysuria, no fever, no numbness and no weakness      HPI Comments: Lawrence Reynolds is a 74 y.o. male who presents to the Emergency Department complaining of constant bilateral lower back pain that has been present for 1-2 weeks. Pt states that he doesn't remember the exact onset of pain but gradually came on over a short period of time. Pt denies known injury or fall that triggered his pain. Pt has tried taking Psychologist, prison and probation services and Back with little relief. Pt states that pain is consistent and movement makes it worse, but lying down makes it better. Pt denies fever, nausea, chest pain, abdominal pain. Pt reports a hx of prostate problems and recurrent headaches. There is no radiation of pain, he denies weakness or numbness in his legs and denies any urinary or bowel incontinence or retention.  Denies abdominal pain.    Past Medical History  Diagnosis Date  . Diverticulitis   . Headache(784.0)   . BPH (benign prostatic hypertrophy)    Past Surgical History  Procedure Laterality Date  . Hernia repair    . Joint replacement    . Cholecystectomy    . Total knee arthroplasty    . Small intestine surgery     Family History  Problem Relation Age of Onset  . Cancer  Brother     lung  . Seizures Brother   . Dementia Brother    History  Substance Use Topics  . Smoking status: Former Research scientist (life sciences)  . Smokeless tobacco: Not on file  . Alcohol Use: No    Review of Systems  Constitutional: Negative for fever and chills.  Cardiovascular: Negative for chest pain.  Gastrointestinal: Negative for nausea, vomiting, abdominal pain and constipation.  Genitourinary: Negative for dysuria.  Musculoskeletal: Positive for back pain.  Neurological: Negative for weakness and numbness.      Allergies  Review of patient's allergies indicates no known allergies.  Home Medications   Prior to Admission medications   Medication Sig Start Date End Date Taking? Authorizing Provider  aspirin EC 81 MG tablet Take 81 mg by mouth daily.   Yes Historical Provider, MD  EPINEPHrine (EPIPEN) 0.3 mg/0.3 mL SOAJ injection Inject 1 mL (1 mg total) into the muscle once. 04/06/13  Yes Nimish Luther Parody, MD  gabapentin (NEURONTIN) 300 MG capsule Take 300 mg by mouth 2 (two) times daily.  01/13/15  Yes Historical Provider, MD  Multiple Vitamin (MULTIVITAMIN WITH MINERALS) TABS Take 1 tablet by mouth daily.   Yes Historical Provider, MD  primidone (MYSOLINE) 50 MG tablet Take 100 mg by mouth 2 (two) times daily.    Yes Historical Provider, MD  Tamsulosin HCl (FLOMAX) 0.4 MG CAPS Take 0.4  mg by mouth every evening.    Yes Historical Provider, MD  traMADol (ULTRAM) 50 MG tablet Take 50 mg by mouth every 6 (six) hours as needed.   Yes Historical Provider, MD  triamcinolone cream (KENALOG) 0.1 % Apply 1 application topically daily as needed. 03/07/13  Yes Historical Provider, MD  VITAMIN E PO Take 1 tablet by mouth daily.   Yes Historical Provider, MD  HYDROcodone-acetaminophen (NORCO/VICODIN) 5-325 MG per tablet Take 1 tablet by mouth every 4 (four) hours as needed. 02/10/15   Evalee Jefferson, PA-C  metroNIDAZOLE (FLAGYL) 500 MG tablet Take 1 tablet (500 mg total) by mouth 3 (three) times  daily. Patient not taking: Reported on 02/10/2015 10/16/13   Radene Gunning, NP  predniSONE (DELTASONE) 10 MG tablet Take 6tabs x1day, then 5tabs x1day, then 4tabs x1day, then 3tabs x1day, then 2tabs x1day, then 1tab x1day, then STOP Patient not taking: Reported on 02/10/2015 02/06/14   Pieter Partridge, DO  verapamil (CALAN) 80 MG tablet TAKE ONE TABLET BY MOUTH THREE TIMES DAILY Patient not taking: Reported on 02/10/2015 07/25/14   Pieter Partridge, DO   BP 146/76 mmHg  Pulse 72  Temp(Src) 98 F (36.7 C) (Oral)  Resp 16  Ht 5\' 10"  (1.778 m)  Wt 283 lb 3.2 oz (128.459 kg)  BMI 40.64 kg/m2  SpO2 96% Physical Exam  Constitutional: He appears well-developed and well-nourished.  HENT:  Head: Normocephalic.  Eyes: Conjunctivae are normal.  Neck: Normal range of motion. Neck supple.  Cardiovascular: Normal rate and intact distal pulses.   Pedal pulses normal.  Pulmonary/Chest: Effort normal.  Abdominal: Soft. Bowel sounds are normal. He exhibits no abdominal bruit, no pulsatile midline mass and no mass. There is no tenderness.  Adipose abdomen.  Nontender.   Musculoskeletal: Normal range of motion. He exhibits no edema.       Lumbar back: He exhibits tenderness. He exhibits no swelling, no edema and no spasm.  Negative straight leg raise bilaterally. ttp lumbar midline at the L4-5 level, tender bilateral paralumbar, no step offs, no deformity.  Neurological: He is alert. He has normal strength. He displays no atrophy and no tremor. No sensory deficit. Gait normal.  Reflex Scores:      Patellar reflexes are 2+ on the right side and 2+ on the left side.      Achilles reflexes are 2+ on the right side and 2+ on the left side. No strength deficit noted in hip and knee flexor and extensor muscle groups.  Ankle flexion and extension intact.  Skin: Skin is warm and dry.  Psychiatric: He has a normal mood and affect.  Nursing note and vitals reviewed.   ED Course  Procedures (including critical care  time)  DIAGNOSTIC STUDIES: Oxygen Saturation is 96% on room air, normal by my interpretation.    COORDINATION OF CARE: 11:56 AM Discussed treatment plan with pt at bedside and pt agreed to plan. Pt will have his lower back X-rayed and given pain medication.    Labs Review Labs Reviewed - No data to display  Imaging Review Dg Lumbar Spine Complete  02/10/2015   CLINICAL DATA:  Low back pain for 2 weeks.  EXAM: LUMBAR SPINE - COMPLETE 4+ VIEW  COMPARISON:  CT scan of the abdomen and pelvis dated 10/14/2013  FINDINGS: There is no fracture or bone destruction. There is narrowing of the L4-5 and L5-S1 disc spaces. There is also moderately severe right facet arthritis at L5-S1 and left facet arthritis at  L4-5. No subluxation. Prominent anterior osteophytes throughout the lumbar spine. Sacroiliac joints are normal.  IMPRESSION: No acute abnormality. 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   Electronically Signed   By: Lorriane Shire M.D.   On: 02/10/2015 13:10     EKG Interpretation None      MDM   Final diagnoses:  Bilateral low back pain without sciatica    Patients labs and/or radiological studies were reviewed and considered during the medical decision making and disposition process.  Results were also discussed with patient. Pt was also seen by Dr. Reather Converse during this visit.  Pt is reproducible with movement and palpation suggesting musculoskeletal source.  Abdomen is very adipose, distended but non tender.  Pt has an abdominal Ct w contrast on record from 3/15 with no mention of AA.  Pt was prescribed hydrocodone. Also advised heat tx, activity as tolerated.  May continue with home nsaid.  F/u with pcp if sx persist or worsen.  No neuro deficit on exam or by history to suggest emergent or surgical presentation.  Also discussed worsened sx that should prompt immediate re-evaluation including distal weakness, bowel/bladder retention/incontinence.   I personally performed  the services described in this documentation, which was scribed in my presence. The recorded information has been reviewed and is accurate.      Evalee Jefferson, PA-C 02/12/15 [id number]  Elnora Morrison, MD 02/14/15 [phone]

## 2015-02-10 NOTE — Discharge Instructions (Signed)
Back Pain, Adult °Low back pain is very common. About 1 in 5 people have back pain. The cause of low back pain is rarely dangerous. The pain often gets better over time. About half of people with a sudden onset of back pain feel better in just 2 weeks. About 8 in 10 people feel better by 6 weeks.  °CAUSES °Some common causes of back pain include: °· Strain of the muscles or ligaments supporting the spine. °· Wear and tear (degeneration) of the spinal discs. °· Arthritis. °· Direct injury to the back. °DIAGNOSIS °Most of the time, the direct cause of low back pain is not known. However, back pain can be treated effectively even when the exact cause of the pain is unknown. Answering your caregiver's questions about your overall health and symptoms is one of the most accurate ways to make sure the cause of your pain is not dangerous. If your caregiver needs more information, he or she may order lab work or imaging tests (X-rays or MRIs). However, even if imaging tests show changes in your back, this usually does not require surgery. °HOME CARE INSTRUCTIONS °For many people, back pain returns. Since low back pain is rarely dangerous, it is often a condition that people can learn to manage on their own.  °· Remain active. It is stressful on the back to sit or stand in one place. Do not sit, drive, or stand in one place for more than 30 minutes at a time. Take short walks on level surfaces as soon as pain allows. Try to increase the length of time you walk each day. °· Do not stay in bed. Resting more than 1 or 2 days can delay your recovery. °· Do not avoid exercise or work. Your body is made to move. It is not dangerous to be active, even though your back may hurt. Your back will likely heal faster if you return to being active before your pain is gone. °· Pay attention to your body when you  bend and lift. Many people have less discomfort when lifting if they bend their knees, keep the load close to their bodies, and  avoid twisting. Often, the most comfortable positions are those that put less stress on your recovering back. °· Find a comfortable position to sleep. Use a firm mattress and lie on your side with your knees slightly bent. If you lie on your back, put a pillow under your knees. °· Only take over-the-counter or prescription medicines as directed by your caregiver. Over-the-counter medicines to reduce pain and inflammation are often the most helpful. Your caregiver may prescribe muscle relaxant drugs. These medicines help dull your pain so you can more quickly return to your normal activities and healthy exercise. °· Put ice on the injured area. °¨ Put ice in a plastic bag. °¨ Place a towel between your skin and the bag. °¨ Leave the ice on for 15-20 minutes, 03-04 times a day for the first 2 to 3 days. After that, ice and heat may be alternated to reduce pain and spasms. °· Ask your caregiver about trying back exercises and gentle massage. This may be of some benefit. °· Avoid feeling anxious or stressed. Stress increases muscle tension and can worsen back pain. It is important to recognize when you are anxious or stressed and learn ways to manage it. Exercise is a great option. °SEEK MEDICAL CARE IF: °· You have pain that is not relieved with rest or medicine. °· You have pain that does not improve in 1 week. °· You have new symptoms. °· You are generally not feeling well. °SEEK   IMMEDIATE MEDICAL CARE IF:   You have pain that radiates from your back into your legs.  You develop new bowel or bladder control problems.  You have unusual weakness or numbness in your arms or legs.  You develop nausea or vomiting.  You develop abdominal pain.  You feel faint. Document Released: 07/19/2005 Document Revised: 01/18/2012 Document Reviewed: 11/20/2013 [facility] Patient Information 2015 [place], [place]. This information is not intended to replace advice given to you by your health care provider. Make sure you  discuss any questions you have with your health care provider.   You may take the hydrocodone prescribed for pain relief.  This will make you drowsy - do not drive within 4 hours of taking this medication.

## 2015-02-10 NOTE — ED Notes (Signed)
Pt states that he has been having low back pain for the past 2 weeks.  Denies any injury.

## 2015-02-10 NOTE — ED Notes (Signed)
Pt verbalized understanding of no driving and to use caution within 4 hours of taking pain meds due to meds cause drowsiness 

## 2015-02-20 ENCOUNTER — Other Ambulatory Visit: Payer: Self-pay | Admitting: Neurology

## 2015-04-01 ENCOUNTER — Other Ambulatory Visit: Payer: Self-pay | Admitting: Neurology

## 2015-05-13 ENCOUNTER — Other Ambulatory Visit: Payer: Self-pay | Admitting: Neurology

## 2015-05-13 NOTE — Telephone Encounter (Signed)
Rx sent 

## 2015-06-17 ENCOUNTER — Other Ambulatory Visit: Payer: Self-pay | Admitting: Neurology

## 2015-06-19 ENCOUNTER — Other Ambulatory Visit: Payer: Self-pay | Admitting: Neurology

## 2015-09-14 ENCOUNTER — Other Ambulatory Visit: Payer: Self-pay | Admitting: Neurology

## 2015-09-15 NOTE — Telephone Encounter (Signed)
Last OV: 02/06/14 Next OV: 0/0/0

## 2015-09-19 ENCOUNTER — Other Ambulatory Visit: Payer: Self-pay | Admitting: Neurology

## 2015-09-19 NOTE — Telephone Encounter (Signed)
Verapamil refused. Not seen in a year and a half.

## 2015-09-27 ENCOUNTER — Other Ambulatory Visit: Payer: Self-pay | Admitting: Neurology

## 2015-09-29 NOTE — Telephone Encounter (Signed)
Last OV: 02/06/14 Next OV: 0/0/00 Will start verapamil 80mg  three times daily.   No refill to be given. > 1 year since OV.

## 2016-09-21 ENCOUNTER — Telehealth: Payer: Self-pay

## 2016-09-21 NOTE — Telephone Encounter (Signed)
[id number] PLEASE CALL PATIENT TO SCHEDULE A TCS

## 2016-09-23 NOTE — Telephone Encounter (Signed)
Pt's wife, Izora Gala, called back. Pt has constipation and some rectal bleeding. Ov with Neil Crouch, PA on 10/14/2016 at 2:30 Pm.

## 2016-09-23 NOTE — Telephone Encounter (Signed)
Called, many rings and no answer.

## 2016-10-14 ENCOUNTER — Encounter: Payer: Self-pay | Admitting: Gastroenterology

## 2016-10-14 ENCOUNTER — Other Ambulatory Visit: Payer: Self-pay

## 2016-10-14 ENCOUNTER — Ambulatory Visit (INDEPENDENT_AMBULATORY_CARE_PROVIDER_SITE_OTHER): Payer: Medicare HMO | Admitting: Gastroenterology

## 2016-10-14 VITALS — BP 137/77 | HR 71 | Temp 98.1°F | Ht 70.0 in | Wt 263.8 lb

## 2016-10-14 DIAGNOSIS — Z800 Family history of malignant neoplasm of digestive organs: Secondary | ICD-10-CM | POA: Diagnosis not present

## 2016-10-14 DIAGNOSIS — Z860100 Personal history of colon polyps, unspecified: Secondary | ICD-10-CM | POA: Insufficient documentation

## 2016-10-14 DIAGNOSIS — K5904 Chronic idiopathic constipation: Secondary | ICD-10-CM

## 2016-10-14 MED ORDER — PEG 3350-KCL-NA BICARB-NACL 420 G PO SOLR: 4000.0000 mL | ORAL | 0 refills | Status: DC

## 2016-10-14 NOTE — Progress Notes (Signed)
Primary Care Physician:  Monico Blitz, MD  Primary Gastroenterologist:  Garfield Cornea, MD   Chief Complaint  Patient presents with  . Colonoscopy    consult  . Constipation  . Rectal Bleeding    HPI:  Lawrence Reynolds is a 76 y.o. male here At the request of Dr. Donneta Romberg for colonoscopy. Patient has a history of colon polyps, family history of colon cancer, last colonoscopy done in 2008 by Dr. Gala Romney. He'll left-sided diverticula, normal terminal ileum, multiple colon polyps removed, some tubular adenomas. He is supposed to come back in 2013 but he did not. He states he has a bowel movement every day but usually [place] 133. Occasionally rectal bleeding with straining. Denies melena. He takes Vicodin rarely for pain. Denies daily pain medication. Denies upper GI symptoms. He has chronic intermittent right lower quadrant pain which is somewhat vague. Wife brought it up to me, patient would not elaborate. He's had multiple CTs in the past for this pain.   Current Outpatient Prescriptions  Medication Sig Dispense Refill  . cholecalciferol (VITAMIN D) 1000 units tablet Take 2,000 Units by mouth daily.    Marland Kitchen gabapentin (NEURONTIN) 300 MG capsule Take 300 mg by mouth 2 (two) times daily.   0  . HYDROcodone-acetaminophen (NORCO/VICODIN) 5-325 MG per tablet Take 1 tablet by mouth every 4 (four) hours as needed. 20 tablet 0  . Multiple Vitamin (MULTIVITAMIN WITH MINERALS) TABS Take 1 tablet by mouth daily.    . Omega-3 Fatty Acids (FISH OIL) 1200 MG CAPS Take 1 capsule by mouth daily.    . primidone (MYSOLINE) 50 MG tablet Take 100 mg by mouth 2 (two) times daily.     . Tamsulosin HCl (FLOMAX) 0.4 MG CAPS Take 0.4 mg by mouth every evening.     . triamcinolone cream (KENALOG) 0.1 % Apply 1 application topically daily as needed.    Marland Kitchen VITAMIN E PO Take 1 tablet by mouth daily.    Marland Kitchen EPINEPHrine (EPIPEN) 0.3 mg/0.3 mL SOAJ injection Inject 1 mL (1 mg total) into the muscle once. (Patient not taking:  Reported on 10/14/2016) 1 Device 6  . polyethylene glycol-electrolytes (TRILYTE) 420 g solution Take 4,000 mLs by mouth as directed. 4000 mL 0   No current facility-administered medications for this visit.     Allergies as of 10/14/2016  . (No Known Allergies)    Past Medical History:  Diagnosis Date  . BPH (benign prostatic hypertrophy)   . Diverticulitis   . NIDPOEUM(353.6)     Past Surgical History:  Procedure Laterality Date  . CHOLECYSTECTOMY    . COLONOSCOPY  2008   Dr. Gala Romney: Tubular adenomas, diverticulosis.  Marland Kitchen HERNIA REPAIR     twice  . JOINT REPLACEMENT    . SMALL INTESTINE SURGERY     ???? Per patient for diverticulitis  . TOTAL KNEE ARTHROPLASTY      Family History  Problem Relation Age of Onset  . Cancer Brother     lung  . Seizures Brother   . Dementia Brother   . Colon cancer Paternal Uncle   . Prostate cancer Paternal Uncle   . Colon cancer Other     nephew    Social History   Social History  . Marital status: Married    Spouse name: N/A  . Number of children: N/A  . Years of education: N/A   Occupational History  . Not on file.   Social History Main Topics  . Smoking status: Former Research scientist (life sciences)  .  Smokeless tobacco: Never Used  . Alcohol use No  . Drug use: No  . Sexual activity: No   Other Topics Concern  . Not on file   Social History Narrative  . No narrative on file      ROS:  General: Negative for anorexia, weight loss, fever, chills, fatigue, weakness. Eyes: Negative for vision changes.  ENT: Negative for hoarseness, difficulty swallowing , nasal congestion. CV: Negative for chest pain, angina, palpitations, dyspnea on exertion, peripheral edema.  Respiratory: Negative for dyspnea at rest, dyspnea on exertion, cough, sputum, wheezing.  GI: See history of present illness. GU:  Negative for dysuria, hematuria, urinary incontinence, urinary frequency, nocturnal urination.  MS: Negative for joint pain, low back pain.  Derm:  Negative for rash or itching.  Neuro: Negative for weakness, abnormal sensation, seizure, frequent headaches, memory loss, confusion.  Psych: Negative for anxiety, depression, suicidal ideation, hallucinations.  Endo: Negative for unusual weight change.  Heme: Negative for bruising or bleeding. Allergy: Negative for rash or hives.    Physical Examination:  BP 137/77   Pulse 71   Temp 98.1 F (36.7 C) (Oral)   Ht 5\' 10"  (1.778 m)   Wt 263 lb 12.8 oz (119.7 kg)   BMI 37.85 kg/m    General: Well-nourished, well-developed in no acute distress.  Head: Normocephalic, atraumatic.   Eyes: Conjunctiva pink, no icterus. Mouth: Oropharyngeal mucosa moist and pink , no lesions erythema or exudate. Neck: Supple without thyromegaly, masses, or lymphadenopathy.  Lungs: Clear to auscultation bilaterally.  Heart: Regular rate and rhythm, no murmurs rubs or gallops.  Abdomen: Bowel sounds are normal, nontender, nondistended, no hepatosplenomegaly or masses, no abdominal bruits or    hernia , no rebound or guarding.   Rectal: deferred Extremities: No lower extremity edema. No clubbing or deformities.  Neuro: Alert and oriented x 4 , grossly normal neurologically.  Skin: Warm and dry, no rash or jaundice.   Psych: Alert and cooperative, normal mood and affect.

## 2016-10-14 NOTE — Patient Instructions (Signed)
1. Start Linzess once daily on empty stomach 5 days before your bowel prep/colonoscopy. Samples provided. If you want RX to stay on this for chronic constipation, please let me know. 2. Colonoscopy as scheduled. See separate instructions.

## 2016-10-14 NOTE — Assessment & Plan Note (Signed)
76 year old gentleman with personal history of tubular adenomas, family history of colon cancer in multiple family members who presents for overdue surveillance colonoscopy. Last colonoscopy 2008. Patient has some mild constipation. We'll start Linzess 142mcg daily to take 5 days before procedure. Samples provided May call for prescription if you like to stay on it.  I have discussed the risks, alternatives, benefits with regards to but not limited to the risk of reaction to medication, bleeding, infection, perforation and the patient is agreeable to proceed. Written consent to be obtained.

## 2016-10-15 NOTE — Progress Notes (Signed)
cc'ed to pcp °

## 2016-11-10 ENCOUNTER — Telehealth: Payer: Self-pay

## 2016-11-10 NOTE — Telephone Encounter (Signed)
Pt's wife called this morning and said that he just does not want to have the TCS. I am going to take him off the schedule.

## 2016-11-10 NOTE — Telephone Encounter (Signed)
Noted  

## 2016-11-18 ENCOUNTER — Ambulatory Visit (HOSPITAL_COMMUNITY): Admission: RE | Admit: 2016-11-18 | Payer: Medicare HMO | Source: Ambulatory Visit | Admitting: Internal Medicine

## 2016-11-18 ENCOUNTER — Encounter (HOSPITAL_COMMUNITY): Admission: RE | Payer: Self-pay | Source: Ambulatory Visit

## 2016-11-18 SURGERY — COLONOSCOPY
Anesthesia: Moderate Sedation

## 2017-07-16 ENCOUNTER — Emergency Department (HOSPITAL_COMMUNITY): Payer: Medicare HMO

## 2017-07-16 ENCOUNTER — Encounter (HOSPITAL_COMMUNITY): Payer: Self-pay | Admitting: *Deleted

## 2017-07-16 ENCOUNTER — Emergency Department (HOSPITAL_COMMUNITY)
Admission: EM | Admit: 2017-07-16 | Discharge: 2017-07-16 | Disposition: A | Discharge status: Home / Self Care | Payer: Medicare HMO | Attending: Emergency Medicine | Admitting: Emergency Medicine

## 2017-07-16 DIAGNOSIS — Z79899 Other long term (current) drug therapy: Secondary | ICD-10-CM | POA: Insufficient documentation

## 2017-07-16 DIAGNOSIS — J40 Bronchitis, not specified as acute or chronic: Secondary | ICD-10-CM

## 2017-07-16 DIAGNOSIS — R05 Cough: Secondary | ICD-10-CM | POA: Diagnosis present

## 2017-07-16 DIAGNOSIS — Z87891 Personal history of nicotine dependence: Secondary | ICD-10-CM | POA: Insufficient documentation

## 2017-07-16 LAB — COMPREHENSIVE METABOLIC PANEL
ALK PHOS: 65 U/L (ref 38–126)
ALT: 21 U/L (ref 17–63)
AST: 28 U/L (ref 15–41)
Albumin: 4.4 g/dL (ref 3.5–5.0)
Anion gap: 9 (ref 5–15)
BILIRUBIN TOTAL: 0.8 mg/dL (ref 0.3–1.2)
BUN: 15 mg/dL (ref 6–20)
CALCIUM: 9 mg/dL (ref 8.9–10.3)
CO2: 28 mmol/L (ref 22–32)
CREATININE: 0.8 mg/dL (ref 0.61–1.24)
Chloride: 100 mmol/L — ABNORMAL LOW (ref 101–111)
GFR calc Af Amer: >60 mL/min (ref >60)
Glucose, Bld: 99 mg/dL (ref 65–99)
Potassium: 3.8 mmol/L (ref 3.5–5.1)
Sodium: 137 mmol/L (ref 135–145)
TOTAL PROTEIN: 7.7 g/dL (ref 6.5–8.1)

## 2017-07-16 LAB — CBC WITH DIFFERENTIAL/PLATELET
BASOS ABS: 0.1 10*3/uL (ref 0.0–0.1)
Basophils Relative: 1 %
Eosinophils Absolute: 0.2 10*3/uL (ref 0.0–0.7)
Eosinophils Relative: 2 %
HEMATOCRIT: 45.8 % (ref 39.0–52.0)
Hemoglobin: 14.7 g/dL (ref 13.0–17.0)
LYMPHS PCT: 13 %
Lymphs Abs: 1.3 10*3/uL (ref 0.7–4.0)
MCH: 31.1 pg (ref 26.0–34.0)
MCHC: 32.1 g/dL (ref 30.0–36.0)
MCV: 97 fL (ref 78.0–100.0)
MONO ABS: 1.4 10*3/uL — AB (ref 0.1–1.0)
Monocytes Relative: 14 %
NEUTROS ABS: 6.7 10*3/uL (ref 1.7–7.7)
Neutrophils Relative %: 70 %
Platelets: 191 10*3/uL (ref 150–400)
RBC: 4.72 MIL/uL (ref 4.22–5.81)
RDW: 13.6 % (ref 11.5–15.5)
WBC: 9.6 10*3/uL (ref 4.0–10.5)

## 2017-07-16 MED ORDER — ALBUTEROL SULFATE (2.5 MG/3ML) 0.083% IN NEBU
2.5000 mg | INHALATION_SOLUTION | Freq: Once | RESPIRATORY_TRACT | Status: AC
  Administered 2017-07-16: 2.5 mg via RESPIRATORY_TRACT
  Filled 2017-07-16: qty 3

## 2017-07-16 MED ORDER — DOXYCYCLINE HYCLATE 100 MG PO CAPS: 100.0000 mg | ORAL_CAPSULE | Freq: Two times a day (BID) | ORAL | 0 refills | Status: DC

## 2017-07-16 MED ORDER — IPRATROPIUM-ALBUTEROL 0.5-2.5 (3) MG/3ML IN SOLN
3.0000 mL | Freq: Once | RESPIRATORY_TRACT | Status: AC
  Administered 2017-07-16: 3 mL via RESPIRATORY_TRACT
  Filled 2017-07-16: qty 3

## 2017-07-16 MED ORDER — ALBUTEROL SULFATE HFA 108 (90 BASE) MCG/ACT IN AERS: 2.0000 | INHALATION_SPRAY | RESPIRATORY_TRACT | Status: DC | PRN

## 2017-07-16 MED ORDER — ACETAMINOPHEN 500 MG PO TABS
1000.0000 mg | ORAL_TABLET | Freq: Once | ORAL | Status: AC
  Administered 2017-07-16: 1000 mg via ORAL
  Filled 2017-07-16: qty 2

## 2017-07-16 MED ORDER — ALBUTEROL SULFATE HFA 108 (90 BASE) MCG/ACT IN AERS: 2.0000 | INHALATION_SPRAY | Freq: Four times a day (QID) | RESPIRATORY_TRACT | Status: DC

## 2017-07-16 MED ORDER — ALBUTEROL SULFATE HFA 108 (90 BASE) MCG/ACT IN AERS
2.0000 | INHALATION_SPRAY | RESPIRATORY_TRACT | Status: DC | PRN
  Administered 2017-07-16: 2 via RESPIRATORY_TRACT
  Filled 2017-07-16: qty 6.7

## 2017-07-16 NOTE — ED Triage Notes (Signed)
Pt c/o abd swelling, cough- productive for 4 days. Unknown of fever

## 2017-07-16 NOTE — ED Provider Notes (Signed)
[facility] EMERGENCY DEPARTMENT Provider Note   CSN: [id number] Arrival date & time: 07/16/17  1242     History   Chief Complaint Chief Complaint  Patient presents with  . Cough    HPI Lawrence Reynolds is a 76 y.o. male.  Patient complains of cough and shortness of breath mild fever   The history is provided by the patient.  Cough  This is a new problem. The current episode started 2 days ago. The problem occurs constantly. The problem has not changed since onset.The cough is productive of brown sputum. The maximum temperature recorded prior to his arrival was 100 to 100.9 F. Pertinent negatives include no chest pain and no headaches.    Past Medical History:  Diagnosis Date  . BPH (benign prostatic hypertrophy)   . Diverticulitis   . RKYHCWCB(762.8)     Patient Active Problem List   Diagnosis Date Noted  . Family hx of colon cancer 10/14/2016  . History of colonic polyps 10/14/2016  . Gastroenteritis 10/14/2013  . Leukocytosis 10/14/2013  . Diarrhea 10/14/2013  . Nausea & vomiting 10/14/2013  . Abdominal pain 10/14/2013  . Angioedema 04/03/2013  . HEADACHE 03/19/2009  . TREMOR, ESSENTIAL 10/03/2008  . 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 TESTOSTERONE DEFICIENCY 06/04/2008  . ERECTILE DYSFUNCTION 05/24/2008  . BENIGN PROSTATIC HYPERTROPHY, WITH OBSTRUCTION 05/24/2008  . ABDOMINAL PAIN, LOWER 02/09/2008  . ELEVATED BLOOD PRESSURE WITHOUT DIAGNOSIS OF HYPERTENSION 01/01/2008  . SLEEP APNEA, OBSTRUCTIVE, MODERATE 09/15/2006  . HYPERLIPIDEMIA 07/19/2006  . OBESITY NOS 07/19/2006  . ANXIETY 07/19/2006  . GERD 07/19/2006  . HERNIA, UMBILICAL 31/51/7616  . HERNIA, VENTRAL 07/19/2006  . Constipation 07/19/2006  . OSTEOARTHRITIS 07/19/2006  . LOW BACK PAIN 07/19/2006  . DIVERTICULITIS, HX OF 07/19/2006    Past Surgical History:  Procedure Laterality Date  . CHOLECYSTECTOMY    . COLONOSCOPY  2008   Dr. Gala Romney: Tubular adenomas, diverticulosis.    Marland Kitchen HERNIA REPAIR     twice  . JOINT REPLACEMENT    . SMALL INTESTINE SURGERY     ???? Per patient for diverticulitis  . TOTAL KNEE ARTHROPLASTY         Home Medications    Prior to Admission medications   Medication Sig Start Date End Date Taking? Authorizing Provider  cholecalciferol (VITAMIN D) 1000 units tablet Take 2,000 Units by mouth daily.    [provider]  doxycycline (VIBRAMYCIN) 100 MG capsule Take 1 capsule (100 mg total) by mouth 2 (two) times daily. One po bid x 7 days 07/16/17   Milton Ferguson, MD  EPINEPHrine (EPIPEN) 0.3 mg/0.3 mL SOAJ injection Inject 1 mL (1 mg total) into the muscle once. Patient not taking: Reported on 10/14/2016 04/06/13   Doree Albee, MD  gabapentin (NEURONTIN) 300 MG capsule Take 300 mg by mouth 2 (two) times daily.  01/13/15   [provider]  HYDROcodone-acetaminophen (NORCO/VICODIN) 5-325 MG per tablet Take 1 tablet by mouth every 4 (four) hours as needed. 02/10/15   Evalee Jefferson, PA-C  Multiple Vitamin (MULTIVITAMIN WITH MINERALS) TABS Take 1 tablet by mouth daily.    [provider]  Omega-3 Fatty Acids (FISH OIL) 1200 MG CAPS Take 1 capsule by mouth daily.    [provider]  polyethylene glycol-electrolytes (TRILYTE) 420 g solution Take 4,000 mLs by mouth as directed. 10/14/16   Rourk, Cristopher Estimable, MD  primidone (MYSOLINE) 50 MG tablet Take 100 mg by mouth 2 (two) times daily.     [provider]  Tamsulosin HCl (FLOMAX) 0.4 MG CAPS Take 0.4 mg by mouth every evening.     [provider]  triamcinolone cream (KENALOG) 0.1 % Apply 1 application topically daily as needed. 03/07/13   [provider]  VITAMIN E PO Take 1 tablet by mouth daily.    [provider]    Family History Family History  Problem Relation Age of Onset  . Cancer Brother        lung  . Seizures Brother   . Dementia Brother   . Colon cancer Paternal Uncle   . Prostate cancer Paternal Uncle   .  Colon cancer Other        nephew    Social History Social History   Tobacco Use  . Smoking status: Former Research scientist (life sciences)  . Smokeless tobacco: Never Used  Substance Use Topics  . Alcohol use: No  . Drug use: No     Allergies   Patient has no known allergies.   Review of Systems Review of Systems  Constitutional: Negative for appetite change and fatigue.  HENT: Negative for congestion, ear discharge and sinus pressure.   Eyes: Negative for discharge.  Respiratory: Positive for cough.   Cardiovascular: Negative for chest pain.  Gastrointestinal: Negative for abdominal pain and diarrhea.  Genitourinary: Negative for frequency and hematuria.  Musculoskeletal: Negative for back pain.  Skin: Negative for rash.  Neurological: Negative for seizures and headaches.  Psychiatric/Behavioral: Negative for hallucinations.     Physical Exam Updated Vital Signs BP (!) 154/77 (BP Location: Right Arm)   Pulse 91   Temp 100 F (37.8 C) (Oral)   Resp 18   Ht 5\' 9"  (1.753 m)   Wt 117.9 kg (260 lb)   SpO2 94%   BMI 38.40 kg/m   Physical Exam  Constitutional: He is oriented to person, place, and time. He appears well-developed.  HENT:  Head: Normocephalic.  Eyes: Conjunctivae and EOM are normal. No scleral icterus.  Neck: Neck supple. No thyromegaly present.  Cardiovascular: Normal rate and regular rhythm. Exam reveals no gallop and no friction rub.  No murmur heard. Pulmonary/Chest: No stridor. He has wheezes. He has no rales. He exhibits no tenderness.  Abdominal: He exhibits no distension. There is no tenderness. There is no rebound.  Musculoskeletal: Normal range of motion. He exhibits no edema.  Lymphadenopathy:    He has no cervical adenopathy.  Neurological: He is oriented to person, place, and time. He exhibits normal muscle tone. Coordination normal.  Skin: No rash noted. No erythema.  Psychiatric: He has a normal mood and affect. His behavior is normal.     ED  Treatments / Results  Labs (all labs ordered are listed, but only abnormal results are displayed) Labs Reviewed  CBC WITH DIFFERENTIAL/PLATELET - Abnormal; Notable for the following components:      Result Value   Monocytes Absolute 1.4 (*)    All other components within normal limits  COMPREHENSIVE METABOLIC PANEL - Abnormal; Notable for the following components:   Chloride 100 (*)    All other components within normal limits    EKG  EKG Interpretation None       Radiology Dg Chest 2 View  Result Date: 07/16/2017 CLINICAL DATA:  Cough.  Abdominal swelling. EXAM: CHEST  2 VIEW COMPARISON:  None. FINDINGS: The heart size and mediastinal contours are within normal limits. Both lungs are clear. The visualized skeletal structures are unremarkable. IMPRESSION: No active cardiopulmonary disease. Electronically Signed  By: Kerby Moors M.D.   On: 07/16/2017 14:08    Procedures Procedures (including critical care time)  Medications Ordered in ED Medications  albuterol (PROVENTIL HFA;VENTOLIN HFA) 108 (90 Base) MCG/ACT inhaler 2 puff (not administered)  ipratropium-albuterol (DUONEB) 0.5-2.5 (3) MG/3ML nebulizer solution 3 mL (3 mLs Nebulization Given 07/16/17 1556)  albuterol (PROVENTIL) (2.5 MG/3ML) 0.083% nebulizer solution 2.5 mg (2.5 mg Nebulization Given 07/16/17 1556)     Initial Impression / Assessment and Plan / ED Course  I have reviewed the triage vital signs and the nursing notes.  Pertinent labs & imaging results that were available during my care of the patient were reviewed by me and considered in my medical decision making (see chart for details).     Patient with bronchitis and bronchospasm.  She will be treated with doxycycline and albuterol  Final Clinical Impressions(s) / ED Diagnoses   Final diagnoses:  Bronchitis    ED Discharge Orders        Ordered    doxycycline (VIBRAMYCIN) 100 MG capsule  2 times daily     07/16/17 1702       Milton Ferguson, MD 07/16/17 1708

## 2017-07-16 NOTE — Discharge Instructions (Signed)
Take Tylenol for any fever aches drink plenty of fluids and follow-up with your doctor in 1-2 weeks if not improving

## 2017-07-16 NOTE — ED Notes (Signed)
Date and time results received: 07/16/17 4:56 PM  (use smartphrase ".now" to insert current time)  Test: Troponin Critical Value: 0.04  Name of Provider Notified: [place]  Orders Received? Or Actions Taken?: Orders Received - See Orders for details

## 2017-12-20 ENCOUNTER — Emergency Department (HOSPITAL_COMMUNITY): Payer: Medicare HMO

## 2017-12-20 ENCOUNTER — Encounter (HOSPITAL_COMMUNITY): Payer: Self-pay | Admitting: Emergency Medicine

## 2017-12-20 ENCOUNTER — Other Ambulatory Visit: Payer: Self-pay

## 2017-12-20 ENCOUNTER — Inpatient Hospital Stay (HOSPITAL_COMMUNITY)
Admission: EM | Admit: 2017-12-20 | Discharge: 2017-12-24 | DRG: 871 | Disposition: A | Discharge status: Home Health | Payer: Medicare HMO | Attending: Internal Medicine | Admitting: Internal Medicine

## 2017-12-20 DIAGNOSIS — N400 Enlarged prostate without lower urinary tract symptoms: Secondary | ICD-10-CM | POA: Diagnosis not present

## 2017-12-20 DIAGNOSIS — I361 Nonrheumatic tricuspid (valve) insufficiency: Secondary | ICD-10-CM | POA: Diagnosis not present

## 2017-12-20 DIAGNOSIS — R6520 Severe sepsis without septic shock: Secondary | ICD-10-CM | POA: Diagnosis present

## 2017-12-20 DIAGNOSIS — G309 Alzheimer's disease, unspecified: Secondary | ICD-10-CM

## 2017-12-20 DIAGNOSIS — N390 Urinary tract infection, site not specified: Secondary | ICD-10-CM | POA: Diagnosis present

## 2017-12-20 DIAGNOSIS — Z6841 Body Mass Index (BMI) 40.0 and over, adult: Secondary | ICD-10-CM | POA: Diagnosis not present

## 2017-12-20 DIAGNOSIS — F0390 Unspecified dementia without behavioral disturbance: Secondary | ICD-10-CM | POA: Diagnosis present

## 2017-12-20 DIAGNOSIS — G92 Toxic encephalopathy: Secondary | ICD-10-CM | POA: Diagnosis present

## 2017-12-20 DIAGNOSIS — G250 Essential tremor: Secondary | ICD-10-CM | POA: Diagnosis present

## 2017-12-20 DIAGNOSIS — A419 Sepsis, unspecified organism: Secondary | ICD-10-CM | POA: Diagnosis not present

## 2017-12-20 DIAGNOSIS — R0902 Hypoxemia: Secondary | ICD-10-CM

## 2017-12-20 DIAGNOSIS — E538 Deficiency of other specified B group vitamins: Secondary | ICD-10-CM | POA: Diagnosis present

## 2017-12-20 DIAGNOSIS — N179 Acute kidney failure, unspecified: Secondary | ICD-10-CM | POA: Diagnosis present

## 2017-12-20 DIAGNOSIS — I119 Hypertensive heart disease without heart failure: Secondary | ICD-10-CM | POA: Diagnosis present

## 2017-12-20 DIAGNOSIS — F0280 Dementia in other diseases classified elsewhere without behavioral disturbance: Secondary | ICD-10-CM | POA: Diagnosis not present

## 2017-12-20 DIAGNOSIS — Z79899 Other long term (current) drug therapy: Secondary | ICD-10-CM

## 2017-12-20 DIAGNOSIS — G43909 Migraine, unspecified, not intractable, without status migrainosus: Secondary | ICD-10-CM | POA: Diagnosis present

## 2017-12-20 DIAGNOSIS — J189 Pneumonia, unspecified organism: Secondary | ICD-10-CM | POA: Diagnosis present

## 2017-12-20 DIAGNOSIS — G4733 Obstructive sleep apnea (adult) (pediatric): Secondary | ICD-10-CM | POA: Diagnosis not present

## 2017-12-20 DIAGNOSIS — E876 Hypokalemia: Secondary | ICD-10-CM | POA: Diagnosis present

## 2017-12-20 DIAGNOSIS — Z96659 Presence of unspecified artificial knee joint: Secondary | ICD-10-CM | POA: Diagnosis present

## 2017-12-20 DIAGNOSIS — Z818 Family history of other mental and behavioral disorders: Secondary | ICD-10-CM

## 2017-12-20 DIAGNOSIS — K219 Gastro-esophageal reflux disease without esophagitis: Secondary | ICD-10-CM | POA: Diagnosis present

## 2017-12-20 DIAGNOSIS — E860 Dehydration: Secondary | ICD-10-CM | POA: Diagnosis present

## 2017-12-20 DIAGNOSIS — G43009 Migraine without aura, not intractable, without status migrainosus: Secondary | ICD-10-CM | POA: Diagnosis not present

## 2017-12-20 DIAGNOSIS — Z820 Family history of epilepsy and other diseases of the nervous system: Secondary | ICD-10-CM

## 2017-12-20 DIAGNOSIS — Z86010 Personal history of colonic polyps: Secondary | ICD-10-CM

## 2017-12-20 DIAGNOSIS — Z8042 Family history of malignant neoplasm of prostate: Secondary | ICD-10-CM | POA: Diagnosis not present

## 2017-12-20 DIAGNOSIS — E785 Hyperlipidemia, unspecified: Secondary | ICD-10-CM | POA: Diagnosis present

## 2017-12-20 DIAGNOSIS — Z9049 Acquired absence of other specified parts of digestive tract: Secondary | ICD-10-CM

## 2017-12-20 DIAGNOSIS — Z800 Family history of malignant neoplasm of digestive organs: Secondary | ICD-10-CM | POA: Diagnosis not present

## 2017-12-20 DIAGNOSIS — N170 Acute kidney failure with tubular necrosis: Secondary | ICD-10-CM | POA: Diagnosis present

## 2017-12-20 DIAGNOSIS — Z801 Family history of malignant neoplasm of trachea, bronchus and lung: Secondary | ICD-10-CM

## 2017-12-20 DIAGNOSIS — Z87891 Personal history of nicotine dependence: Secondary | ICD-10-CM

## 2017-12-20 DIAGNOSIS — I480 Paroxysmal atrial fibrillation: Secondary | ICD-10-CM | POA: Diagnosis not present

## 2017-12-20 LAB — URINALYSIS, ROUTINE W REFLEX MICROSCOPIC
Bilirubin Urine: NEGATIVE
Glucose, UA: NEGATIVE mg/dL
Hgb urine dipstick: NEGATIVE
Ketones, ur: NEGATIVE mg/dL
Nitrite: NEGATIVE
Protein, ur: NEGATIVE mg/dL
Specific Gravity, Urine: 1.015 (ref 1.005–1.030)
WBC, UA: >50 WBC/hpf — ABNORMAL HIGH (ref 0–5)
pH: 7 (ref 5.0–8.0)

## 2017-12-20 LAB — CBC WITH DIFFERENTIAL/PLATELET
BASOS PCT: 0 %
Basophils Absolute: 0 10*3/uL (ref 0.0–0.1)
EOS ABS: 0 10*3/uL (ref 0.0–0.7)
EOS PCT: 0 %
HEMATOCRIT: 43.6 % (ref 39.0–52.0)
Hemoglobin: 14.3 g/dL (ref 13.0–17.0)
LYMPHS PCT: 4 %
Lymphs Abs: 1.5 10*3/uL (ref 0.7–4.0)
MCH: 31.8 pg (ref 26.0–34.0)
MCHC: 32.8 g/dL (ref 30.0–36.0)
MCV: 96.9 fL (ref 78.0–100.0)
Monocytes Absolute: 3.7 10*3/uL — ABNORMAL HIGH (ref 0.1–1.0)
Monocytes Relative: 10 %
Neutro Abs: 31.8 10*3/uL — ABNORMAL HIGH (ref 1.7–7.7)
Neutrophils Relative %: 86 %
PLATELETS: 197 10*3/uL (ref 150–400)
RBC: 4.5 MIL/uL (ref 4.22–5.81)
RDW: 13.8 % (ref 11.5–15.5)
WBC Morphology: INCREASED
WBC: 37 10*3/uL — ABNORMAL HIGH (ref 4.0–10.5)

## 2017-12-20 LAB — PROTIME-INR
INR: 1.04
Prothrombin Time: 13.5 seconds (ref 11.4–15.2)

## 2017-12-20 LAB — PHOSPHORUS: PHOSPHORUS: 3.3 mg/dL (ref 2.5–4.6)

## 2017-12-20 LAB — COMPREHENSIVE METABOLIC PANEL WITH GFR
ALT: 24 U/L (ref 17–63)
AST: 25 U/L (ref 15–41)
Albumin: 4.3 g/dL (ref 3.5–5.0)
Alkaline Phosphatase: 53 U/L (ref 38–126)
Anion gap: 10 (ref 5–15)
BUN: 27 mg/dL — ABNORMAL HIGH (ref 6–20)
CO2: 28 mmol/L (ref 22–32)
Calcium: 9.4 mg/dL (ref 8.9–10.3)
Chloride: 101 mmol/L (ref 101–111)
Creatinine, Ser: 1.79 mg/dL — ABNORMAL HIGH (ref 0.61–1.24)
GFR calc Af Amer: 41 mL/min — ABNORMAL LOW
GFR calc non Af Amer: 35 mL/min — ABNORMAL LOW
Glucose, Bld: 122 mg/dL — ABNORMAL HIGH (ref 65–99)
Potassium: 4.2 mmol/L (ref 3.5–5.1)
Sodium: 139 mmol/L (ref 135–145)
Total Bilirubin: 1.7 mg/dL — ABNORMAL HIGH (ref 0.3–1.2)
Total Protein: 7.5 g/dL (ref 6.5–8.1)

## 2017-12-20 LAB — TSH: TSH: 0.717 u[IU]/mL (ref 0.350–4.500)

## 2017-12-20 LAB — I-STAT CG4 LACTIC ACID, ED
LACTIC ACID, VENOUS: 3.37 mmol/L — AB (ref 0.5–1.9)
Lactic Acid, Venous: 2.42 mmol/L (ref 0.5–1.9)

## 2017-12-20 LAB — MRSA PCR SCREENING: MRSA BY PCR: NEGATIVE

## 2017-12-20 LAB — MAGNESIUM: Magnesium: 1.5 mg/dL — ABNORMAL LOW (ref 1.7–2.4)

## 2017-12-20 MED ORDER — SODIUM CHLORIDE 0.9 % IV SOLN
1500.0000 mg | INTRAVENOUS | Status: DC
Start: 2017-12-21 — End: 2017-12-24
  Administered 2017-12-21 – 2017-12-23 (×3): 1500 mg via INTRAVENOUS
  Filled 2017-12-20 (×6): qty 1500

## 2017-12-20 MED ORDER — TAMSULOSIN HCL 0.4 MG PO CAPS
0.4000 mg | ORAL_CAPSULE | Freq: Every evening | ORAL | Status: DC
  Administered 2017-12-20 – 2017-12-23 (×4): 0.4 mg via ORAL
  Filled 2017-12-20 (×4): qty 1

## 2017-12-20 MED ORDER — ACETAMINOPHEN 325 MG PO TABS
650.0000 mg | ORAL_TABLET | Freq: Once | ORAL | Status: AC
  Administered 2017-12-20: 650 mg via ORAL
  Filled 2017-12-20: qty 2

## 2017-12-20 MED ORDER — DONEPEZIL HCL 5 MG PO TABS
10.0000 mg | ORAL_TABLET | Freq: Every day | ORAL | Status: DC
  Administered 2017-12-20 – 2017-12-24 (×5): 10 mg via ORAL
  Filled 2017-12-20 (×6): qty 2

## 2017-12-20 MED ORDER — ONDANSETRON HCL 4 MG PO TABS: 4.0000 mg | ORAL_TABLET | Freq: Four times a day (QID) | ORAL | Status: DC | PRN

## 2017-12-20 MED ORDER — ACETAMINOPHEN 325 MG PO TABS
650.0000 mg | ORAL_TABLET | Freq: Four times a day (QID) | ORAL | Status: DC | PRN
  Administered 2017-12-20 – 2017-12-22 (×6): 650 mg via ORAL
  Filled 2017-12-20 (×6): qty 2

## 2017-12-20 MED ORDER — VANCOMYCIN HCL IN DEXTROSE 1-5 GM/200ML-% IV SOLN
1000.0000 mg | Freq: Once | INTRAVENOUS | Status: DC
  Filled 2017-12-20: qty 200

## 2017-12-20 MED ORDER — VITAMIN D 1000 UNITS PO TABS
2000.0000 [IU] | ORAL_TABLET | Freq: Every day | ORAL | Status: DC
  Administered 2017-12-20 – 2017-12-24 (×5): 2000 [IU] via ORAL
  Filled 2017-12-20 (×5): qty 2

## 2017-12-20 MED ORDER — SODIUM CHLORIDE 0.9 % IV BOLUS (SEPSIS)
1000.0000 mL | Freq: Once | INTRAVENOUS | Status: AC
  Administered 2017-12-20: 1000 mL via INTRAVENOUS

## 2017-12-20 MED ORDER — ZOLPIDEM TARTRATE 5 MG PO TABS
5.0000 mg | ORAL_TABLET | Freq: Every evening | ORAL | Status: DC | PRN
  Administered 2017-12-20 – 2017-12-22 (×2): 5 mg via ORAL
  Filled 2017-12-20 (×2): qty 1

## 2017-12-20 MED ORDER — PIPERACILLIN-TAZOBACTAM 3.375 G IVPB 30 MIN
3.3750 g | Freq: Once | INTRAVENOUS | Status: AC
  Administered 2017-12-20: 3.375 g via INTRAVENOUS
  Filled 2017-12-20: qty 50

## 2017-12-20 MED ORDER — ONDANSETRON HCL 4 MG/2ML IJ SOLN: 4.0000 mg | Freq: Four times a day (QID) | INTRAMUSCULAR | Status: DC | PRN

## 2017-12-20 MED ORDER — VANCOMYCIN HCL 10 G IV SOLR
2000.0000 mg | Freq: Once | INTRAVENOUS | Status: DC
  Filled 2017-12-20: qty 2000

## 2017-12-20 MED ORDER — OXYCODONE HCL 5 MG PO TABS
5.0000 mg | ORAL_TABLET | Freq: Once | ORAL | Status: AC
  Administered 2017-12-20: 5 mg via ORAL
  Filled 2017-12-20: qty 1

## 2017-12-20 MED ORDER — SODIUM CHLORIDE 0.9 % IV SOLN
INTRAVENOUS | Status: DC
  Administered 2017-12-20 – 2017-12-22 (×3): via INTRAVENOUS

## 2017-12-20 MED ORDER — PIPERACILLIN-TAZOBACTAM 3.375 G IVPB
3.3750 g | Freq: Three times a day (TID) | INTRAVENOUS | Status: DC
  Administered 2017-12-20 – 2017-12-24 (×11): 3.375 g via INTRAVENOUS
  Filled 2017-12-20 (×11): qty 50

## 2017-12-20 MED ORDER — SODIUM CHLORIDE 0.9% FLUSH
3.0000 mL | Freq: Two times a day (BID) | INTRAVENOUS | Status: DC
  Administered 2017-12-20 – 2017-12-24 (×7): 3 mL via INTRAVENOUS

## 2017-12-20 MED ORDER — FAMOTIDINE 20 MG PO TABS
20.0000 mg | ORAL_TABLET | Freq: Every day | ORAL | Status: DC
  Administered 2017-12-20 – 2017-12-24 (×5): 20 mg via ORAL
  Filled 2017-12-20 (×5): qty 1

## 2017-12-20 MED ORDER — ACETAMINOPHEN 650 MG RE SUPP: 650.0000 mg | Freq: Four times a day (QID) | RECTAL | Status: DC | PRN

## 2017-12-20 MED ORDER — GABAPENTIN 300 MG PO CAPS
300.0000 mg | ORAL_CAPSULE | Freq: Two times a day (BID) | ORAL | Status: DC
  Administered 2017-12-20 – 2017-12-24 (×8): 300 mg via ORAL
  Filled 2017-12-20 (×8): qty 1

## 2017-12-20 MED ORDER — VANCOMYCIN HCL IN DEXTROSE 1-5 GM/200ML-% IV SOLN
1000.0000 mg | Freq: Once | INTRAVENOUS | Status: AC
  Administered 2017-12-20: 1000 mg via INTRAVENOUS
  Filled 2017-12-20: qty 200

## 2017-12-20 MED ORDER — HEPARIN SODIUM (PORCINE) 5000 UNIT/ML IJ SOLN
5000.0000 [IU] | Freq: Three times a day (TID) | INTRAMUSCULAR | Status: DC
  Administered 2017-12-20 – 2017-12-23 (×8): 5000 [IU] via SUBCUTANEOUS
  Filled 2017-12-20 (×8): qty 1

## 2017-12-20 MED ORDER — VANCOMYCIN HCL IN DEXTROSE 1-5 GM/200ML-% IV SOLN
1000.0000 mg | INTRAVENOUS | Status: AC
  Administered 2017-12-20: 1000 mg via INTRAVENOUS
  Filled 2017-12-20: qty 200

## 2017-12-20 MED ORDER — PRIMIDONE 50 MG PO TABS
100.0000 mg | ORAL_TABLET | Freq: Two times a day (BID) | ORAL | Status: DC
  Administered 2017-12-20 – 2017-12-24 (×8): 100 mg via ORAL
  Filled 2017-12-20 (×15): qty 2

## 2017-12-20 NOTE — Progress Notes (Signed)
Tylenol given for fever.

## 2017-12-20 NOTE — H&P (Signed)
History and Physical    [name] MLY:[id number] DOB: 08/11/1940 DOA: 12/20/2017  PCP: Monico Blitz, MD   I have briefly reviewed patients previous medical reports in [facility].  Patient coming from: Home  Chief Complaint: Fever, weakness, altered mental status  HPI: Lawrence Reynolds is a 77 year old male with past medical history significant for obesity, BPH, obstructive sleep apnea (no using CPAP), chronic history of intermittent headaches, dementia, GERD and essential tremor; who presented to the emergency department secondary to worsening mental status changes, weakness and fever.  Patient was too confused to participate much on the history at this moment.  Patient's wife was the one providing history; she reported that he has been in his usual health of state, also very early morning of admission when she noted that he was more confused than at baseline, felt that he was warm to touch and was experiencing increased weakness throughout his whole body (unable to get up from the commode).  Patient wife reported that she check his temperature and it was around 101.  Estrone over in the urine has been reported and also noticed some dark yellow appearance.  There has not been any productive cough, shortness of breath, nausea, vomiting, abdominal pain, chest pain, difficulty breathing, diarrhea, melena, hematochezia, hematuria or any other complaints. Patient reported having some headache the night before, but disease not unusual for him.  On the date of admission there was not really any headaches or concerns for focal neurologic deficits  ED Course: Patient met sepsis criteria on presentation to the ED, chest x-ray 8 failed to demonstrate acute cardiopulmonary process, UA suggesting infection in the urine as source patient received fluid resuscitation as per sepsis protocol, culture was taken and initiated on broad-spectrum antibiotics..   Review of Systems:  All other systems  reviewed with wife at bedside and apart from HPI, are negative.  Past Medical History:  Diagnosis Date  . BPH (benign prostatic hypertrophy)   . Diverticulitis   . CLEXNTZG(017.4)     Past Surgical History:  Procedure Laterality Date  . CHOLECYSTECTOMY    . COLONOSCOPY  2008   Dr. Gala Romney: Tubular adenomas, diverticulosis.  Marland Kitchen HERNIA REPAIR     twice  . JOINT REPLACEMENT    . SMALL INTESTINE SURGERY     ???? Per patient for diverticulitis  . TOTAL KNEE ARTHROPLASTY      Social History  reports that he has quit smoking. He has never used smokeless tobacco. He reports that he does not drink alcohol or use drugs.  No Known Allergies  Family History  Problem Relation Age of Onset  . Cancer Brother        lung  . Seizures Brother   . Dementia Brother   . Colon cancer Paternal Uncle   . Prostate cancer Paternal Uncle   . Colon cancer Other        nephew    Prior to Admission medications   Medication Sig Start Date End Date Taking? Authorizing Provider  Aspirin-Salicylamide-Caffeine (BC HEADACHE POWDER PO) Take 1 packet by mouth daily as needed.   Yes [provider]  cholecalciferol (VITAMIN D) 1000 units tablet Take 2,000 Units by mouth daily.   Yes [provider]  donepezil (ARICEPT) 10 MG tablet Take 1 tablet by mouth daily. 11/21/17  Yes [provider]  EPINEPHrine (EPIPEN) 0.3 mg/0.3 mL SOAJ injection Inject 1 mL (1 mg total) into the muscle once. 04/06/13  Yes Doree Albee, MD  gabapentin (NEURONTIN) 300 MG capsule Take 300 mg by mouth 2 (two) times daily.  01/13/15  Yes [provider]  ibuprofen (ADVIL,MOTRIN) 800 MG tablet Take 800 mg by mouth every 8 (eight) hours as needed for headache.   Yes [provider]  primidone (MYSOLINE) 50 MG tablet Take 100 mg by mouth 2 (two) times daily.    Yes [provider]  Tamsulosin HCl (FLOMAX) 0.4 MG CAPS Take 0.4 mg by mouth every evening.    Yes [provider]    VITAMIN E PO Take 1 tablet by mouth daily.   Yes [provider]  triamcinolone cream (KENALOG) 0.1 % Apply 1 application topically daily as needed. 03/07/13   [provider]    Physical Exam: Vitals:   12/20/17 1500 12/20/17 1515 12/20/17 1530 12/20/17 1600  BP: 116/70 99/62 100/75 (!) 98/53  Pulse: 100 (!) 101 97 94  Resp: (!) 21 15 18 18   Temp:    (!) 100.4 F (38 C)  TempSrc:    Oral  SpO2: 97% 95% 94% 95%  Weight:    122 kg (268 lb 15.4 oz)  Height:    5' 8"  (1.727 m)    Constitutional: Warm to touch, oriented x2, in no major distress, denies chest pain, palpitations, shortness of breath, abdominal pain, nausea and vomiting. Eyes: PERTLA, lids and conjunctivae normal, no icterus, no nystagmus. ENMT: Mucous membranes are moist. Posterior pharynx clear of any exudate or lesions. No thrush  Neck: supple, no masses, no thyromegaly, unable to properly assess JVD due to body habitus. Respiratory: clear to auscultation bilaterally, no wheezing, no crackles. Normal respiratory effort. No accessory muscle use.  Cardiovascular: S1 & S2 heard, tachycardic, no murmurs / rubs / gallops. No extremity edema. 2+ pedal pulses. No carotid bruits.  Abdomen: Obese, no distension, no tenderness, no masses palpated. No hepatosplenomegaly. Bowel sounds normal.  Musculoskeletal: no clubbing / cyanosis. No joint deformity upper and lower extremities. Good ROM, Normal muscle tone.  Skin: no rashes, lesions or ulcers.  Patient with a small lipoma in his back, which is nontender. Neurologic: CN 2-12 grossly intact. Sensation intact, Strength 4/5 in all 4 limbs due to poor effort.Marland Kitchen  Psychiatric: Patient is confused, oriented only to person and place.  Overall with poor insight and not really remembering what he is doing in the hospital.  Mood is a stable, no suicidal ideation, no hallucinations, no agitation.     Labs on Admission: I have personally reviewed following labs and imaging  studies  CBC: Recent Labs  Lab 12/20/17 1153  WBC 37.0*  NEUTROABS 31.8*  HGB 14.3  HCT 43.6  MCV 96.9  PLT 416   Basic Metabolic Panel: Recent Labs  Lab 12/20/17 1153  NA 139  K 4.2  CL 101  CO2 28  GLUCOSE 122*  BUN 27*  CREATININE 1.79*  CALCIUM 9.4   Liver Function Tests: Recent Labs  Lab 12/20/17 1153  AST 25  ALT 24  ALKPHOS 53  BILITOT 1.7*  PROT 7.5  ALBUMIN 4.3   Coagulation Profile: Recent Labs  Lab 12/20/17 1153  INR 1.04   Urine analysis:    Component Value Date/Time   COLORURINE YELLOW 12/20/2017 1429   APPEARANCEUR HAZY (A) 12/20/2017 1429   LABSPEC 1.015 12/20/2017 1429   PHURINE 7.0 12/20/2017 1429   GLUCOSEU NEGATIVE 12/20/2017 [place] 12/20/2017 1429   HGBUR negative 01/01/2008 0000   BILIRUBINUR NEGATIVE 12/20/2017 1429   [place]  12/20/2017 [place] 12/20/2017 1429   UROBILINOGEN 0.2 10/14/2013 0835   NITRITE NEGATIVE 12/20/2017 1429   LEUKOCYTESUR MODERATE (A) 12/20/2017 1429    Radiological Exams on Admission: Dg Chest Portable 1 View  Result Date: 12/20/2017 CLINICAL DATA:  Confusion, weakness low-grade fever, former smoking history EXAM: PORTABLE CHEST 1 VIEW COMPARISON:  Chest x-ray of 07/16/2017 FINDINGS: The lungs are not as well aerated but no pneumonia or effusion is seen. Leads overlie the right cardiophrenic angle. Mediastinal and hilar contours are stable and the heart is mildly enlarged and stable. No bony abnormality is seen. IMPRESSION: No active lung disease. Slightly diminished aeration. Stable mild cardiomegaly. Electronically Signed   By: Ivar Drape M.D.   On: 12/20/2017 12:15    EKG: Independently reviewed.  Some nonspecific T wave flattening appreciated, sinus tachycardia, normal axis.   Assessment/Plan 1-sepsis [facility]): Patient met sepsis criteria on admission with elevated WBCs, elevated heart rate, suspect infection source his urine, elevated lactic acid and also  high temperature. -Given the fact that his blood pressure was soft and he was actively experiencing worsening mentation patient was admitted to stepdown bed -Continue fluid resuscitation as per sepsis protocol -Started on broad-spectrum antibiotics  -follow blood cultures, and urine cultures -PRN antipyretics  -no complaining of any pain. -CXR clear for underlying active cardiopulmonary process.   2-HLD (hyperlipidemia) -Repeat lipid panel -Patient was not taking any statins at this moment.  3-SLEEP APNEA, OBSTRUCTIVE, MODERATE -Not using CPAP -For now continue oxygen supplementation  4-Essential tremor -Continue Mysoline   5-GERD -continue famotidine   6-BPH (benign prostatic hyperplasia) -Continue Flomax -currently w/o signs of urinary retention   7-Dementia: unknown baseline -per family mild to moderate (still functional, but needs some assistance with difficult tasks) -continue supportive care -continue aricept -continue treatment of infection    8-toxic encephalopathy: In the setting of underlying dementia -Most likely triggered by acute infection -Continue antibiotics -Follow supportive care and assess improvement in his mentation. -will check TSH, B12 and RPR  9-morbid obesity  -Body mass index is 40.9 kg/m. -low calorie diet and increase physical activity discussed with patient and wife at bedside -will need further intervention and discussion, once mentation is more clear.  10-acute kidney injury: -In the setting of UTI, dehydration and decreased perfusion with sepsis -Minimize the use of nephrotoxic agents and provide fluid resuscitation -Follow creatinine trend in a.m. -If failed to improve proceed with renal ultrasound and further work-up.  Time: 70 minutes   DVT prophylaxis: Heparin Code Status: Full code Family Communication: Wife at bedside Disposition Plan: To be determined; but hopefully home once infection is under controlled and patient  mentation back to baseline. Consults called: None Admission status: Inpatient, length of stay more than 2 midnights; stepdown bed.   Barton Dubois MD [facility] Pager [phone]  If 7PM-7AM, please contact night-coverage [email or web address] Password [email or web address]  12/20/2017, 6:24 PM

## 2017-12-20 NOTE — ED Notes (Signed)
Date and time results received: 12/20/17 1428  Test: I-stat Lactic Acid Critical Value: 3.37  Name of Provider Notified: Ray  Orders Received? Or Actions Taken?: no new orders at this time.

## 2017-12-20 NOTE — ED Triage Notes (Signed)
Pt family states pt woke up confused, weakness and low bp.  Fever of 100.0 in triage. BP 76/46

## 2017-12-20 NOTE — ED Provider Notes (Signed)
[facility] EMERGENCY DEPARTMENT Provider Note   CSN: [id number] Arrival date & time: 12/20/17  1128     History   Chief Complaint Chief Complaint  Patient presents with  . Altered Mental Status    HPI KHIREE BUKHARI is a 77 y.o. male. Level 5 caveat secondary to dementia and increased confusion HPI  77 year old man history of mild dementia presents today with fever, tachycardia, and hypertension.  Wife is a historian.  She states that patient was in his usual state of health last night.  He had some complaints of headache, but she states he commonly has headaches.  Otherwise he has been eating and doing his usual activities.  This morning he was weak and she had to help him up off the commode.  He had increased confusion.  She has not noted any vomiting, cough, lateralized weakness, or rashes.  He states that he was in the bathroom on the commode and she is not sure what he was doing when he was unable to get up.  Patient has no complaints of headache today.  He complains of generalized weakness but does not have any other specific complaints.  Past Medical History:  Diagnosis Date  . BPH (benign prostatic hypertrophy)   . Diverticulitis   . ERDEYCXK(481.8)     Patient Active Problem List   Diagnosis Date Noted  . Family hx of colon cancer 10/14/2016  . History of colonic polyps 10/14/2016  . Gastroenteritis 10/14/2013  . Leukocytosis 10/14/2013  . Diarrhea 10/14/2013  . Nausea & vomiting 10/14/2013  . Abdominal pain 10/14/2013  . Angioedema 04/03/2013  . HEADACHE 03/19/2009  . TREMOR, ESSENTIAL 10/03/2008  . 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 TESTOSTERONE DEFICIENCY 06/04/2008  . ERECTILE DYSFUNCTION 05/24/2008  . BENIGN PROSTATIC HYPERTROPHY, WITH OBSTRUCTION 05/24/2008  . ABDOMINAL PAIN, LOWER 02/09/2008  . ELEVATED BLOOD PRESSURE WITHOUT DIAGNOSIS OF HYPERTENSION 01/01/2008  . SLEEP APNEA, OBSTRUCTIVE, MODERATE 09/15/2006  . HYPERLIPIDEMIA  07/19/2006  . OBESITY NOS 07/19/2006  . ANXIETY 07/19/2006  . GERD 07/19/2006  . HERNIA, UMBILICAL 56/31/4970  . HERNIA, VENTRAL 07/19/2006  . Constipation 07/19/2006  . OSTEOARTHRITIS 07/19/2006  . LOW BACK PAIN 07/19/2006  . DIVERTICULITIS, HX OF 07/19/2006    Past Surgical History:  Procedure Laterality Date  . CHOLECYSTECTOMY    . COLONOSCOPY  2008   Dr. Gala Romney: Tubular adenomas, diverticulosis.  Marland Kitchen HERNIA REPAIR     twice  . JOINT REPLACEMENT    . SMALL INTESTINE SURGERY     ???? Per patient for diverticulitis  . TOTAL KNEE ARTHROPLASTY          Home Medications    Prior to Admission medications   Medication Sig Start Date End Date Taking? Authorizing Provider  cholecalciferol (VITAMIN D) 1000 units tablet Take 2,000 Units by mouth daily.    [provider]  doxycycline (VIBRAMYCIN) 100 MG capsule Take 1 capsule (100 mg total) by mouth 2 (two) times daily. One po bid x 7 days 07/16/17   Milton Ferguson, MD  EPINEPHrine (EPIPEN) 0.3 mg/0.3 mL SOAJ injection Inject 1 mL (1 mg total) into the muscle once. Patient not taking: Reported on 10/14/2016 04/06/13   Doree Albee, MD  gabapentin (NEURONTIN) 300 MG capsule Take 300 mg by mouth 2 (two) times daily.  01/13/15   [provider]  HYDROcodone-acetaminophen (NORCO/VICODIN) 5-325 MG per tablet Take 1 tablet by mouth every 4 (four) hours as needed. 02/10/15   Evalee Jefferson, PA-C  Multiple Vitamin (MULTIVITAMIN  WITH MINERALS) TABS Take 1 tablet by mouth daily.    [provider]  Omega-3 Fatty Acids (FISH OIL) 1200 MG CAPS Take 1 capsule by mouth daily.    [provider]  polyethylene glycol-electrolytes (TRILYTE) 420 g solution Take 4,000 mLs by mouth as directed. 10/14/16   Rourk, Cristopher Estimable, MD  primidone (MYSOLINE) 50 MG tablet Take 100 mg by mouth 2 (two) times daily.     [provider]  Tamsulosin HCl (FLOMAX) 0.4 MG CAPS Take 0.4 mg by mouth every evening.     [provider]  triamcinolone cream (KENALOG) 0.1 % Apply 1 application topically daily as needed. 03/07/13   [provider]  VITAMIN E PO Take 1 tablet by mouth daily.    [provider]    Family History Family History  Problem Relation Age of Onset  . Cancer Brother        lung  . Seizures Brother   . Dementia Brother   . Colon cancer Paternal Uncle   . Prostate cancer Paternal Uncle   . Colon cancer Other        nephew    Social History Social History   Tobacco Use  . Smoking status: Former Research scientist (life sciences)  . Smokeless tobacco: Never Used  Substance Use Topics  . Alcohol use: No  . Drug use: No     Allergies   Patient has no known allergies.   Review of Systems Review of Systems  Unable to perform ROS: Mental status change     Physical Exam Updated Vital Signs BP 117/72   Pulse (!) 102   Temp 98.8 F (37.1 C) (Oral)   Resp 19   Ht 1.753 m (5\' 9" )   Wt 119.3 kg (263 lb)   SpO2 100%   BMI 38.84 kg/m   Physical Exam  Constitutional: He appears well-developed and well-nourished.  HENT:  Head: Normocephalic and atraumatic.  Right Ear: External ear normal.  Left Ear: External ear normal.  Mouth/Throat: Oropharynx is clear and moist.  Eyes: Pupils are equal, round, and reactive to light. Conjunctivae and EOM are normal.  Neck: Normal range of motion. Neck supple.  Cardiovascular: Tachycardia present.  Pulmonary/Chest: Effort normal and breath sounds normal.  Abdominal: Soft. Bowel sounds are normal.  Genitourinary: Rectum normal and penis normal.  Musculoskeletal: Normal range of motion. He exhibits no edema or tenderness.  Neurological: He is alert. He displays normal reflexes. No cranial nerve deficit or sensory deficit. He exhibits normal muscle tone. Coordination normal.  Oriented to person and place but not to date  Skin: Skin is warm and dry. Capillary refill takes less than 2 seconds. No rash noted. There is pallor.  No rashes or skin  breakdown noted  Psychiatric: He has a normal mood and affect.  Nursing note and vitals reviewed.    ED Treatments / Results  Labs (all labs ordered are listed, but only abnormal results are displayed) Labs Reviewed  COMPREHENSIVE METABOLIC PANEL - Abnormal; Notable for the following components:      Result Value   Glucose, Bld 122 (*)    BUN 27 (*)    Creatinine, Ser 1.79 (*)    Total Bilirubin 1.7 (*)    GFR calc non Af Amer 35 (*)    GFR calc Af Amer 41 (*)    All other components within normal limits  CBC WITH DIFFERENTIAL/PLATELET - Abnormal; Notable for the following components:   WBC 37.0 (*)  Neutro Abs 31.8 (*)    Monocytes Absolute 3.7 (*)    All other components within normal limits  I-STAT CG4 LACTIC ACID, ED - Abnormal; Notable for the following components:   Lactic Acid, Venous 2.42 (*)    All other components within normal limits  CULTURE, BLOOD (ROUTINE X 2)  CULTURE, BLOOD (ROUTINE X 2)  PROTIME-INR  URINALYSIS, ROUTINE W REFLEX MICROSCOPIC  I-STAT CG4 LACTIC ACID, ED    EKG EKG Interpretation  Date/Time:  Tuesday Dec 20 2017 11:45:58 EDT Ventricular Rate:  101 PR Interval:    QRS Duration: 84 QT Interval:  312 QTC Calculation: 405 R Axis:   58 Text Interpretation:  Sinus tachycardia Non-specific ST-t changes HEART RATE INCREASED SINCE since last tracing 04/03/2013 Confirmed by Pattricia Boss [phone]) on 12/20/2017 1:08:48 PM   Radiology Dg Chest Portable 1 View  Result Date: 12/20/2017 CLINICAL DATA:  Confusion, weakness low-grade fever, former smoking history EXAM: PORTABLE CHEST 1 VIEW COMPARISON:  Chest x-Devora Tortorella of 07/16/2017 FINDINGS: The lungs are not as well aerated but no pneumonia or effusion is seen. Leads overlie the right cardiophrenic angle. Mediastinal and hilar contours are stable and the heart is mildly enlarged and stable. No bony abnormality is seen. IMPRESSION: No active lung disease. Slightly diminished aeration. Stable mild  cardiomegaly. Electronically Signed   By: Ivar Drape M.D.   On: 12/20/2017 12:15    Procedures Procedures (including critical care time)  Medications Ordered in ED Medications  vancomycin (VANCOCIN) IVPB 1000 mg/200 mL premix (0 mg Intravenous Stopped 12/20/17 1319)  piperacillin-tazobactam (ZOSYN) IVPB 3.375 g (has no administration in time range)  vancomycin (VANCOCIN) 1,500 mg in sodium chloride 0.9 % 500 mL IVPB (has no administration in time range)  sodium chloride 0.9 % bolus 1,000 mL (0 mLs Intravenous Stopped 12/20/17 1326)    And  sodium chloride 0.9 % bolus 1,000 mL (0 mLs Intravenous Stopped 12/20/17 1244)    And  sodium chloride 0.9 % bolus 1,000 mL (0 mLs Intravenous Stopped 12/20/17 1240)    And  sodium chloride 0.9 % bolus 1,000 mL (0 mLs Intravenous Stopped 12/20/17 1330)  piperacillin-tazobactam (ZOSYN) IVPB 3.375 g (0 g Intravenous Stopped 12/20/17 1238)  acetaminophen (TYLENOL) tablet 650 mg (650 mg Oral Given 12/20/17 1329)  Initial lactate is elevated   Initial Impression / Assessment and Plan / ED Course  I have reviewed the triage vital signs and the nursing notes.  Pertinent labs & imaging results that were available during my care of the patient were reviewed by me and considered in my medical decision making (see chart for details).     Patient was presents today with fever, tachycardia, and hypertension.  Patient had cultures obtained,, 30 cc/kg normal saline given, broad-spectrum antibiotics of Zosyn and vancomycin were given.  Patient's heart rate has ranged from 94-1 06.  Initial blood pressure 76/46 which has improved to 127/65. Initial lactic is elevated at 2.42, leukocytosis to 37,000, and acute kidney injury with creatinine 1.79 with the first prior being 0.8  Urine obtained and is somewhat dark-colored.  Urinalysis pending. Plan admission for further evaluation and treatment. Discussed with Dr. Dyann Kief  CRITICAL CARE Performed by: Pattricia Boss Total  critical care time: 45 minutes Critical care time was exclusive of separately billable procedures and treating other patients. Critical care was necessary to treat or prevent imminent or life-threatening deterioration. Critical care was time spent personally by me on the following activities: development of treatment plan with patient and/or surrogate  as well as nursing, discussions with consultants, evaluation of patient's response to treatment, examination of patient, obtaining history from patient or surrogate, ordering and performing treatments and interventions, ordering and review of laboratory studies, ordering and review of radiographic studies, pulse oximetry and re-evaluation of patient's condition.  Final Clinical Impressions(s) / ED Diagnoses   Final diagnoses:  Sepsis, due to unspecified organism [facility])  AKI (acute kidney injury) [facility])    ED Discharge Orders    None       Pattricia Boss, MD 12/20/17 1530

## 2017-12-20 NOTE — Progress Notes (Signed)
Pharmacy Antibiotic Note  DERYL GIROUX is a 77 y.o. male admitted on 12/20/2017 with sepsis.  Pharmacy has been consulted for Vancomycin and Zosyn dosing.  Plan: Vancomycin 2000 mg IV x 1 loading dose Vancomycin 1500 mg IV every 24 hours.  Goal trough 15-20 mcg/mL. Zosyn 3.375g IV q8h (4 hour infusion).  Monitor labs, c/s, and vanco trough as indicated  Height: 5\' 9"  (175.3 cm) Weight: 263 lb (119.3 kg) IBW/kg (Calculated) : 70.7  Temp (24hrs), Avg:100 F (37.8 C), Min:100 F (37.8 C), Max:100 F (37.8 C)  Recent Labs  Lab 12/20/17 1153 12/20/17 1155  WBC 37.0*  --   CREATININE 1.79*  --   LATICACIDVEN  --  2.42*    Estimated Creatinine Clearance: 44.7 mL/min (A) (by C-G formula based on SCr of 1.79 mg/dL (H)).    No Known Allergies  Antimicrobials this admission: Vanco 5/21 >>  Zosyn 5/21 >>   Dose adjustments this admission: N/A  Microbiology results: 5/21 BCx: pending   Thank you for allowing pharmacy to be a part of this patient's care.  Ramond Craver 12/20/2017 12:37 PM

## 2017-12-21 ENCOUNTER — Inpatient Hospital Stay (HOSPITAL_COMMUNITY): Payer: Medicare HMO

## 2017-12-21 LAB — LIPID PANEL
Cholesterol: 168 mg/dL (ref 0–200)
HDL: 61 mg/dL (ref >40)
LDL CALC: 91 mg/dL (ref 0–99)
Total CHOL/HDL Ratio: 2.8 RATIO
Triglycerides: 79 mg/dL (ref <150)
VLDL: 16 mg/dL (ref 0–40)

## 2017-12-21 LAB — BASIC METABOLIC PANEL
ANION GAP: 9 (ref 5–15)
BUN: 21 mg/dL — ABNORMAL HIGH (ref 6–20)
CO2: 24 mmol/L (ref 22–32)
Calcium: 8.3 mg/dL — ABNORMAL LOW (ref 8.9–10.3)
Chloride: 106 mmol/L (ref 101–111)
Creatinine, Ser: 1.11 mg/dL (ref 0.61–1.24)
GFR calc Af Amer: >60 mL/min (ref >60)
GFR calc non Af Amer: >60 mL/min (ref >60)
GLUCOSE: 97 mg/dL (ref 65–99)
POTASSIUM: 3.5 mmol/L (ref 3.5–5.1)
Sodium: 139 mmol/L (ref 135–145)

## 2017-12-21 LAB — VITAMIN B12: Vitamin B-12: 211 pg/mL (ref 180–914)

## 2017-12-21 LAB — CBC
HEMATOCRIT: 41.8 % (ref 39.0–52.0)
Hemoglobin: 13.2 g/dL (ref 13.0–17.0)
MCH: 31.1 pg (ref 26.0–34.0)
MCHC: 31.6 g/dL (ref 30.0–36.0)
MCV: 98.6 fL (ref 78.0–100.0)
PLATELETS: 162 10*3/uL (ref 150–400)
RBC: 4.24 MIL/uL (ref 4.22–5.81)
RDW: 14.2 % (ref 11.5–15.5)
WBC: 27 10*3/uL — AB (ref 4.0–10.5)

## 2017-12-21 LAB — HEMOGLOBIN A1C
HEMOGLOBIN A1C: 5.4 % (ref 4.8–5.6)
Mean Plasma Glucose: 108.28 mg/dL

## 2017-12-21 LAB — RPR: RPR Ser Ql: NONREACTIVE

## 2017-12-21 NOTE — Evaluation (Signed)
Physical Therapy Evaluation Patient Details Name: Lawrence Reynolds MRN: [id number] DOB: 08-08-1940 Today's Date: 12/21/2017   History of Present Illness  Lawrence Reynolds is a 77 year old male with past medical history significant for obesity, BPH, obstructive sleep apnea (no using CPAP), chronic history of intermittent headaches, dementia, GERD and essential tremor; who presented to the emergency department secondary to worsening mental status changes, weakness and fever.  Patient was too confused to participate much on the history at this moment.  Patient's wife was the one providing history; she reported that he has been in his usual health of state, also very early morning of admission when she noted that he was more confused than at baseline, felt that he was warm to touch and was experiencing increased weakness throughout his whole body (unable to get up from the commode).  Patient wife reported that she check his temperature and it was around 101.  Estrone over in the urine has been reported and also noticed some dark yellow appearance.  There has not been any productive cough, shortness of breath, nausea, vomiting, abdominal pain, chest pain, difficulty breathing, diarrhea, melena, hematochezia, hematuria or any other complaints.    Clinical Impression  Patient functioning near baseline for functional mobility and gait, except requiring assistance for sitting up at bedside and requiring longer time for completing functional tasks and ambulation in room/hallway, but no loss of balance.  Patient tolerated sitting up in chair after therapy with family members in room.  Patient will benefit from continued physical therapy in hospital and recommended venue below to increase strength, balance, endurance for safe ADLs and gait.     Follow Up Recommendations Home health PT    Equipment Recommendations  None recommended by PT    Recommendations for Other Services       Precautions /  Restrictions Precautions Precautions: Fall Restrictions Weight Bearing Restrictions: No      Mobility  Bed Mobility Overal bed mobility: Needs Assistance Bed Mobility: Supine to Sit     Supine to sit: Min assist     General bed mobility comments: slow labored movement requiring assistance to pull self up  Transfers Overall transfer level: Needs assistance Equipment used: None Transfers: Sit to/from [employer] Transfers Sit to Stand: Supervision Stand pivot transfers: Min guard       General transfer comment: labored movement  Ambulation/Gait Ambulation/Gait assistance: Min guard Ambulation Distance (Feet): 75 Feet Assistive device: None Gait Pattern/deviations: Decreased step length - right;Decreased step length - left;Decreased stride length Gait velocity: slow   General Gait Details: demonstrates slow labored cadence without  using an AD, no leaning on nearby objects, occasional standing rest breaks, on room air with O2 saturations between 90-92%  Stairs            Wheelchair Mobility    Modified Rankin (Stroke Patients Only)       Balance Overall balance assessment: Mild deficits observed, not formally tested                                           Pertinent Vitals/Pain Pain Assessment: No/denies pain    Home Living Family/patient expects to be discharged to:: Private residence Living Arrangements: Spouse/significant other Available Help at Discharge: Family Type of Home: House Home Access: Stairs to enter Entrance Stairs-Rails: None Entrance Stairs-Number of Steps: 2 Home Layout: Two level Home Equipment: None  Prior Function Level of Independence: Independent         Comments: community ambulator     Journalist, newspaper        Extremity/Trunk Assessment   Upper Extremity Assessment Upper Extremity Assessment: Generalized weakness    Lower Extremity Assessment Lower Extremity Assessment:  Generalized weakness    Cervical / Trunk Assessment Cervical / Trunk Assessment: Normal  Communication   Communication: No difficulties  Cognition Arousal/Alertness: Awake/alert Behavior During Therapy: WFL for tasks assessed/performed Overall Cognitive Status: Within Functional Limits for tasks assessed                                        General Comments      Exercises     Assessment/Plan    PT Assessment Patient needs continued PT services  PT Problem List Decreased strength;Decreased activity tolerance;Decreased balance;Decreased mobility       PT Treatment Interventions Gait training;Stair training;Functional mobility training;Therapeutic activities;Therapeutic exercise;Patient/family education    PT Goals (Current goals can be found in the Care Plan section)  Acute Rehab PT Goals Patient Stated Goal: return home PT Goal Formulation: With patient/family Time For Goal Achievement: 12/26/17 Potential to Achieve Goals: Good    Frequency Min 3X/week   Barriers to discharge        Co-evaluation               AM-PAC PT "6 Clicks" Daily Activity  Outcome Measure Difficulty turning over in bed (including adjusting bedclothes, sheets and blankets)?: None Difficulty moving from lying on back to sitting on the side of the bed? : A Little Difficulty sitting down on and standing up from a chair with arms (e.g., wheelchair, bedside commode, etc,.)?: A Little Help needed moving to and from a bed to chair (including a wheelchair)?: A Little Help needed walking in hospital room?: A Little Help needed climbing 3-5 steps with a railing? : A Little 6 Click Score: 19    End of Session Equipment Utilized During Treatment: Gait belt Activity Tolerance: Patient tolerated treatment well;Patient limited by fatigue Patient left: in chair;with call bell/phone within reach;with family/visitor present Nurse Communication: Mobility status PT Visit Diagnosis:  Unsteadiness on feet (R26.81);Other abnormalities of gait and mobility (R26.89);Muscle weakness (generalized) (M62.81)    Time: 9563-8756 PT Time Calculation (min) (ACUTE ONLY): 24 min   Charges:   PT Evaluation $PT Eval Moderate Complexity: 1 Mod PT Treatments $Therapeutic Activity: 23-37 mins   PT G Codes:        3:12 PM, 01-13-2018 Lonell Grandchild, MPT Physical Therapist with [facility] [phone] office [phone] mobile phone

## 2017-12-21 NOTE — Plan of Care (Signed)
  Problem: Acute Rehab PT Goals(only PT should resolve) Goal: Pt Will Go Supine/Side To Sit Outcome: Progressing Flowsheets (Taken 12/21/2017 1514) Pt will go Supine/Side to Sit: with supervision Goal: Patient Will Transfer Sit To/From Stand Outcome: Progressing Flowsheets (Taken 12/21/2017 1514) Patient will transfer sit to/from stand: with modified independence Goal: Pt Will Transfer Bed To Chair/Chair To Bed Outcome: Progressing Flowsheets (Taken 12/21/2017 1514) Pt will Transfer Bed to Chair/Chair to Bed: with modified independence Goal: Pt Will Ambulate Outcome: Progressing Flowsheets (Taken 12/21/2017 1514) Pt will Ambulate: > 125 feet;with modified independence;with least restrictive assistive device  3:15 PM, 12/21/17 Lonell Grandchild, MPT Physical Therapist with [facility] [phone] office [phone] mobile phone

## 2017-12-21 NOTE — Progress Notes (Signed)
PROGRESS NOTE    Lawrence Reynolds  BOF:[id number] DOB: [birth year] DOA: 12/20/2017 PCP: Monico Blitz, MD    Brief Narrative:  77 year old with past medical history relevant for obesity, benign prostatic hypertrophy, obstructive sleep apnea with no CPAP, dementia, essential tremor, reflux who was admitted to the emergency department with altered mental status, fever and weakness found to have sepsis.   Assessment & Plan:   Principal Problem:   Sepsis ([place]) Active Problems:   HLD (hyperlipidemia)   SLEEP APNEA, OBSTRUCTIVE, MODERATE   Essential tremor   GERD   BPH (benign prostatic hyperplasia)   Dementia   #) Sepsis from unclear source: Currently the most likely source right now appears to be his urine which was quite abnormal on admission.  His chest x-ray is clear.  His white blood cell count continues to decrease on empiric antibiotics. - Follow-up blood cultures ordered 12/20/2017 -Follow-up urine culture ordered 12/20/2017 -Continue IV vancomycin and Zosyn started 12/20/2017  #) Altered mental status/dementia: This is resolving with treatment of his fever and IV fluids. -Continue donepezil 10 mg daily  #) BPH: -Continue tamsulosin 0.4 mg nightly  #) Essential tremor: -Continue primidone 100 mg twice daily  #) Pain/psych: -Continue gabapentin 300 mg twice daily  Fluids: Gentle IV fluids Electrolyte: Monitor and supplement Nutrition: Regular diet  Prophylaxis: Subcu heparin  Disposition: Pending resolution of sepsis and underlying source  Full code   Consultants:   None  Procedures:   None  Antimicrobials:   IV vancomycin and Zosyn started 12/20/2017   Subjective: Patient reports he is doing better today.  He is more alert and oriented.  He denies any pain but does endorse significant weakness.  Overnight he had a fever.  Objective: Vitals:   12/21/17 0500 12/21/17 0600 12/21/17 0839 12/21/17 1011  BP:  113/70  (!) 113/56  Pulse:    78  Resp: 16  19  18   Temp:   99.1 F (37.3 C)   TempSrc:   Oral   SpO2:    96%  Weight: 121.7 kg (268 lb 4.8 oz)     Height:        Intake/Output Summary (Last 24 hours) at 12/21/2017 1140 Last data filed at 12/21/2017 1000 Gross per 24 hour  Intake 4363.33 ml  Output 300 ml  Net 4063.33 ml   Filed Weights   12/20/17 1136 12/20/17 1600 12/21/17 0500  Weight: 119.3 kg (263 lb) 122 kg (268 lb 15.4 oz) 121.7 kg (268 lb 4.8 oz)    Examination:  General exam: No acute distress Respiratory system: Mildly increased work of breathing, no wheezes, rhonchi, rales, diminished lung sounds at bases l. Cardiovascular system: Distant heart sounds, regular rate and rhythm, no murmurs Gastrointestinal system: Abdomen is nondistended, soft and nontender. No organomegaly or masses felt. Normal bowel sounds heard. Central nervous system: Grossly intact, moving all extremities Extremities: 2+ lower extremity edema Skin: No rashes on visible skin Psychiatry: Judgement and insight appear normal. Mood & affect appropriate.     Data Reviewed: I have personally reviewed following labs and imaging studies  CBC: Recent Labs  Lab 12/20/17 1153 12/21/17 0458  WBC 37.0* 27.0*  NEUTROABS 31.8*  --   HGB 14.3 13.2  HCT 43.6 41.8  MCV 96.9 98.6  PLT 197 778   Basic Metabolic Panel: Recent Labs  Lab 12/20/17 1153 12/20/17 1818 12/21/17 0458  NA 139  --  139  K 4.2  --  3.5  CL 101  --  106  CO2 28  --  24  GLUCOSE 122*  --  97  BUN 27*  --  21*  CREATININE 1.79*  --  1.11  CALCIUM 9.4  --  8.3*  MG  --  1.5*  --   PHOS  --  3.3  --    GFR: Estimated Creatinine Clearance: 71.8 mL/min (by C-G formula based on SCr of 1.11 mg/dL). Liver Function Tests: Recent Labs  Lab 12/20/17 1153  AST 25  ALT 24  ALKPHOS 53  BILITOT 1.7*  PROT 7.5  ALBUMIN 4.3   No results for input(s): LIPASE, AMYLASE in the last 168 hours. No results for input(s): AMMONIA in the last 168 hours. Coagulation  Profile: Recent Labs  Lab 12/20/17 1153  INR 1.04   Cardiac Enzymes: No results for input(s): CKTOTAL, CKMB, CKMBINDEX, TROPONINI in the last 168 hours. BNP (last 3 results) No results for input(s): PROBNP in the last 8760 hours. HbA1C: No results for input(s): HGBA1C in the last 72 hours. CBG: No results for input(s): GLUCAP in the last 168 hours. Lipid Profile: Recent Labs    12/21/17 0458  CHOL 168  HDL 61  LDLCALC 91  TRIG 79  CHOLHDL 2.8   Thyroid Function Tests: Recent Labs    12/20/17 1725  TSH 0.717   Anemia Panel: Recent Labs    12/20/17 1726  VITAMINB12 211   Sepsis Labs: Recent Labs  Lab 12/20/17 1155 12/20/17 1417  LATICACIDVEN 2.42* 3.37*    Recent Results (from the past 240 hour(s))  Culture, blood (Routine x 2)     Status: None (Preliminary result)   Collection Time: 12/20/17 11:50 AM  Result Value Ref Range Status   Specimen Description BLOOD LEFT ARM DRAWN BY RN  Final   Special Requests   Final    BOTTLES DRAWN AEROBIC AND ANAEROBIC Blood Culture adequate volume   Culture   Final    NO GROWTH < 24 HOURS Performed at [facility], [address] [place], [place] [address]    Report Status PENDING  Incomplete  Culture, blood (Routine x 2)     Status: None (Preliminary result)   Collection Time: 12/20/17 11:54 AM  Result Value Ref Range Status   Specimen Description BLOOD RIGHT ARM DRAWN BY RN  Final   Special Requests   Final    BOTTLES DRAWN AEROBIC AND ANAEROBIC Blood Culture adequate volume   Culture   Final    NO GROWTH < 24 HOURS Performed at [facility], [address], [place], [place] [address]    Report Status PENDING  Incomplete  MRSA PCR Screening     Status: None   Collection Time: 12/20/17  3:59 PM  Result Value Ref Range Status   MRSA by PCR NEGATIVE NEGATIVE Final    Comment:        The GeneXpert MRSA Assay (FDA approved for NASAL specimens only), is one component of a comprehensive MRSA  colonization surveillance program. It is not intended to diagnose MRSA infection nor to guide or monitor treatment for MRSA infections. Performed at [facility], [address], [place], [place] [address]          Radiology Studies: Dg Chest Portable 1 View  Result Date: 12/20/2017 CLINICAL DATA:  Confusion, weakness low-grade fever, former smoking history EXAM: PORTABLE CHEST 1 VIEW COMPARISON:  Chest x-ray of 07/16/2017 FINDINGS: The lungs are not as well aerated but no pneumonia or effusion is seen. Leads overlie the right cardiophrenic angle. Mediastinal  and hilar contours are stable and the heart is mildly enlarged and stable. No bony abnormality is seen. IMPRESSION: No active lung disease. Slightly diminished aeration. Stable mild cardiomegaly. Electronically Signed   By: Ivar Drape M.D.   On: 12/20/2017 12:15        Scheduled Meds: . cholecalciferol  2,000 Units Oral Daily  . donepezil  10 mg Oral Daily  . famotidine  20 mg Oral Daily  . gabapentin  300 mg Oral BID  . heparin  5,000 Units Subcutaneous Q8H  . primidone  100 mg Oral BID  . sodium chloride flush  3 mL Intravenous Q12H  . tamsulosin  0.4 mg Oral QPM   Continuous Infusions: . sodium chloride 100 mL/hr at 12/20/17 1840  . piperacillin-tazobactam (ZOSYN)  IV 3.375 g (12/21/17 1121)  . vancomycin       LOS: 1 day    Time spent: [place], MD [facility]   If 7PM-7AM, please contact night-coverage [email or web address] Password [email or web address] 12/21/2017, 11:40 AM

## 2017-12-22 ENCOUNTER — Inpatient Hospital Stay (HOSPITAL_COMMUNITY): Payer: Medicare HMO

## 2017-12-22 DIAGNOSIS — I361 Nonrheumatic tricuspid (valve) insufficiency: Secondary | ICD-10-CM

## 2017-12-22 LAB — CBC
HCT: 39.6 % (ref 39.0–52.0)
Hemoglobin: 12.7 g/dL — ABNORMAL LOW (ref 13.0–17.0)
MCH: 31.2 pg (ref 26.0–34.0)
MCHC: 32.1 g/dL (ref 30.0–36.0)
MCV: 97.3 fL (ref 78.0–100.0)
Platelets: 163 10*3/uL (ref 150–400)
RBC: 4.07 MIL/uL — ABNORMAL LOW (ref 4.22–5.81)
RDW: 14 % (ref 11.5–15.5)
WBC: 19 10*3/uL — ABNORMAL HIGH (ref 4.0–10.5)

## 2017-12-22 LAB — COMPREHENSIVE METABOLIC PANEL WITH GFR
Albumin: 3.3 g/dL — ABNORMAL LOW (ref 3.5–5.0)
Alkaline Phosphatase: 47 U/L (ref 38–126)
Anion gap: 7 (ref 5–15)
Calcium: 8.2 mg/dL — ABNORMAL LOW (ref 8.9–10.3)
GFR calc Af Amer: >60 mL/min (ref >60)
GFR calc non Af Amer: >60 mL/min (ref >60)
Glucose, Bld: 110 mg/dL — ABNORMAL HIGH (ref 65–99)
Total Bilirubin: 0.9 mg/dL (ref 0.3–1.2)
Total Protein: 6.5 g/dL (ref 6.5–8.1)

## 2017-12-22 LAB — URINE CULTURE: Culture: NO GROWTH

## 2017-12-22 LAB — COMPREHENSIVE METABOLIC PANEL
ALT: 16 U/L — ABNORMAL LOW (ref 17–63)
AST: 19 U/L (ref 15–41)
BUN: 14 mg/dL (ref 6–20)
CO2: 26 mmol/L (ref 22–32)
Chloride: 103 mmol/L (ref 101–111)
Creatinine, Ser: 1.01 mg/dL (ref 0.61–1.24)
Potassium: 3.6 mmol/L (ref 3.5–5.1)
Sodium: 136 mmol/L (ref 135–145)

## 2017-12-22 LAB — TROPONIN I: Troponin I: <0.03 ng/mL (ref <0.03)

## 2017-12-22 LAB — ECHOCARDIOGRAM COMPLETE
Height: 68 in
Weight: 4306.91 [oz_av]

## 2017-12-22 LAB — MAGNESIUM: Magnesium: 1.8 mg/dL (ref 1.7–2.4)

## 2017-12-22 LAB — PROCALCITONIN: Procalcitonin: 1.93 ng/mL

## 2017-12-22 MED ORDER — KETOROLAC TROMETHAMINE 15 MG/ML IJ SOLN
15.0000 mg | Freq: Once | INTRAMUSCULAR | Status: AC
  Administered 2017-12-22: 15 mg via INTRAVENOUS
  Filled 2017-12-22: qty 1

## 2017-12-22 MED ORDER — BUTALBITAL-APAP-CAFFEINE 50-325-40 MG PO TABS
1.0000 | ORAL_TABLET | ORAL | Status: DC | PRN
  Administered 2017-12-22 – 2017-12-23 (×6): 1 via ORAL
  Filled 2017-12-22 (×6): qty 1

## 2017-12-22 MED ORDER — IOPAMIDOL (ISOVUE-300) INJECTION 61%
30.0000 mL | Freq: Once | INTRAVENOUS | Status: AC | PRN
  Administered 2017-12-22: 30 mL via ORAL

## 2017-12-22 MED ORDER — METOCLOPRAMIDE HCL 5 MG/ML IJ SOLN
10.0000 mg | Freq: Once | INTRAMUSCULAR | Status: AC
  Administered 2017-12-22: 10 mg via INTRAVENOUS
  Filled 2017-12-22: qty 2

## 2017-12-22 MED ORDER — IOPAMIDOL (ISOVUE-300) INJECTION 61%
100.0000 mL | Freq: Once | INTRAVENOUS | Status: AC | PRN
  Administered 2017-12-22: 100 mL via INTRAVENOUS

## 2017-12-22 NOTE — Progress Notes (Signed)
*  PRELIMINARY RESULTS* Echocardiogram STAT 2-D Echocardiogram has been performed. Reader has been texted.  Lawrence Reynolds 12/22/2017, 5:02 PM

## 2017-12-22 NOTE — ED Notes (Signed)
CRITICAL VALUE ALERT  Critical Value:  Lactic Acid 2.42   Date & Time Notied:  12/20/17 1212  Provider Notified: Dr. Jeanell Sparrow   Orders Received/Actions taken: No actions yet

## 2017-12-22 NOTE — Progress Notes (Signed)
Physical Therapy Treatment Patient Details Name: STERLING MONDO MRN: [id number] DOB: [birth year] Today's Date: 12/22/2017    History of Present Illness NAHOME BUBLITZ is a 77 year old male with past medical history significant for obesity, BPH, obstructive sleep apnea (no using CPAP), chronic history of intermittent headaches, dementia, GERD and essential tremor; who presented to the emergency department secondary to worsening mental status changes, weakness and fever.  Patient was too confused to participate much on the history at this moment.  Patient's wife was the one providing history; she reported that he has been in his usual health of state, also very early morning of admission when she noted that he was more confused than at baseline, felt that he was warm to touch and was experiencing increased weakness throughout his whole body (unable to get up from the commode).  Patient wife reported that she check his temperature and it was around 101.  Estrone over in the urine has been reported and also noticed some dark yellow appearance.  There has not been any productive cough, shortness of breath, nausea, vomiting, abdominal pain, chest pain, difficulty breathing, diarrhea, melena, hematochezia, hematuria or any other complaints.    PT Comments    Patient progressing. Min assist for bed mobility, min guard for transfers and min guard for ambulation using IV pole for support 200 feet. Transfers to/from toilet min guard to supervision.  Patient would continue to benefit from skilled physical therapy in current environment and next venue to continue return to prior function and increase strength, endurance, balance, coordination, and functional mobility and gait skills.     Follow Up Recommendations  Home health PT     Equipment Recommendations  None recommended by PT    Recommendations for Other Services       Precautions / Restrictions Precautions Precautions:  Fall Restrictions Weight Bearing Restrictions: No    Mobility  Bed Mobility Overal bed mobility: Needs Assistance Bed Mobility: Supine to Sit     Supine to sit: Min assist     General bed mobility comments: slow labored movement requiring assistance to pull self up  Transfers Overall transfer level: Needs assistance   Transfers: Sit to/from Stand;Stand Pivot Transfers Sit to Stand: Supervision Stand pivot transfers: Min guard       General transfer comment: labored movement  Ambulation/Gait Ambulation/Gait assistance: Min guard Ambulation Distance (Feet): 200 Feet Assistive device: IV Pole Gait Pattern/deviations: Decreased step length - right;Decreased step length - left;Decreased stride length Gait velocity: slow   General Gait Details: demonstrates slow labored cadence, no leaning on nearby objects, occasional standing rest breaks, on room air    Stairs             Wheelchair Mobility    Modified Rankin (Stroke Patients Only)       Balance Overall balance assessment: Mild deficits observed, not formally tested                                          Cognition Arousal/Alertness: Awake/alert Behavior During Therapy: WFL for tasks assessed/performed Overall Cognitive Status: Within Functional Limits for tasks assessed                                        Exercises      General Comments  Pertinent Vitals/Pain Pain Assessment: No/denies pain    Home Living                      Prior Function            PT Goals (current goals can now be found in the care plan section) Progress towards PT goals: Progressing toward goals    Frequency    Min 3X/week      PT Plan Current plan remains appropriate    Co-evaluation              AM-PAC PT "6 Clicks" Daily Activity  Outcome Measure  Difficulty turning over in bed (including adjusting bedclothes, sheets and blankets)?:  None Difficulty moving from lying on back to sitting on the side of the bed? : A Little Difficulty sitting down on and standing up from a chair with arms (e.g., wheelchair, bedside commode, etc,.)?: A Little Help needed moving to and from a bed to chair (including a wheelchair)?: A Little Help needed walking in hospital room?: A Little Help needed climbing 3-5 steps with a railing? : A Little 6 Click Score: 19    End of Session Equipment Utilized During Treatment: Gait belt Activity Tolerance: Patient tolerated treatment well;Patient limited by fatigue Patient left: in chair;with call bell/phone within reach;with chair alarm set Nurse Communication: Mobility status PT Visit Diagnosis: Unsteadiness on feet (R26.81);Other abnormalities of gait and mobility (R26.89);Muscle weakness (generalized) (M62.81)     Time: 5449-2010 PT Time Calculation (min) (ACUTE ONLY): 30 min  Charges:  $Gait Training: 8-22 mins $Therapeutic Activity: 8-22 mins                    G Codes:       Pericles Carmicheal D. Hartnett-Rands, MS, PT Per [place] #[id number] 12/22/2017, 12:10 PM

## 2017-12-22 NOTE — Progress Notes (Signed)
**Note De-identified  Obfuscation** STAT EKG complete, RN notified and placed in patient chart. 

## 2017-12-22 NOTE — Progress Notes (Signed)
PROGRESS NOTE    Lawrence Reynolds  VWU:[id number] DOB: 1941/01/30 DOA: 12/20/2017 PCP: Monico Blitz, MD    Brief Narrative:  77 year old with past medical history relevant for obesity, benign prostatic hypertrophy, obstructive sleep apnea with no CPAP, dementia, essential tremor, reflux who was admitted to the emergency department with altered mental status, fever and weakness found to have sepsis.   Assessment & Plan:   Principal Problem:   Sepsis ([place]) Active Problems:   HLD (hyperlipidemia)   SLEEP APNEA, OBSTRUCTIVE, MODERATE   Essential tremor   GERD   BPH (benign prostatic hyperplasia)   Dementia   #) Sepsis from unclear source: Patient continues to be febrile despite his white blood cell count going down.  He does not have any localizing signs or symptoms.  His blood cultures have been negative and his urine cultures are negative. - Follow-up blood cultures ordered 12/20/2017 -Repeat blood cultures ordered from 12/22/2017 when patient is febrile -Follow-up urine culture ordered 12/20/2017 -Continue IV vancomycin and Zosyn started 12/20/2017 -Obtain CT abdomen pelvis  #) Altered mental status/dementia: Resolved -Continue donepezil 10 mg daily  #) BPH: -Continue tamsulosin 0.4 mg nightly  #) Essential tremor: -Continue primidone 100 mg twice daily  #) Pain/psych: -Continue gabapentin 300 mg twice daily  Fluids: Gentle IV fluids Electrolyte: Monitor and supplement Nutrition: Regular diet  Prophylaxis: Subcu heparin  Disposition: Pending resolution of sepsis and underlying source  Full code   Consultants:   None  Procedures:   None  Antimicrobials:   IV vancomycin and Zosyn started 12/20/2017   Subjective: Patient reports he is doing worse than yesterday.  He continues to report episodes of Reiger's and chills.  He is much more alert and oriented but is quite fatigued.  He denies any nausea, vomiting, abdominal pain, cough.  Objective: Vitals:   12/22/17 0700 12/22/17 0800 12/22/17 1000 12/22/17 1115  BP:  131/70 122/63   Pulse: 97 90 93   Resp: (!) 21 (!) 21 (!) 21   Temp:  (!) 100.5 F (38.1 C)  99.9 F (37.7 C)  TempSrc:  Oral  Oral  SpO2: 96% 96% 98%   Weight:      Height:        Intake/Output Summary (Last 24 hours) at 12/22/2017 1208 Last data filed at 12/22/2017 0700 Gross per 24 hour  Intake 2240 ml  Output 2050 ml  Net 190 ml   Filed Weights   12/20/17 1600 12/21/17 0500 12/22/17 0500  Weight: 122 kg (268 lb 15.4 oz) 121.7 kg (268 lb 4.8 oz) 122.1 kg (269 lb 2.9 oz)    Examination:  General exam: No acute distress Respiratory system: No increased work of breathing, diminished lung sounds at bases, scattered rhonchi, no wheezes Cardiovascular system: Distant heart sounds, regular rate and rhythm, no murmurs Gastrointestinal system: Abdomen is nondistended, soft and nontender. No organomegaly or masses felt. Normal bowel sounds heard. Central nervous system: Grossly intact, moving all extremities Extremities: 2+ lower extremity edema Skin: No rashes on visible skin Psychiatry: Judgement and insight appear normal. Mood & affect appropriate.     Data Reviewed: I have personally reviewed following labs and imaging studies  CBC: Recent Labs  Lab 12/20/17 1153 12/21/17 0458 12/22/17 0428  WBC 37.0* 27.0* 19.0*  NEUTROABS 31.8*  --   --   HGB 14.3 13.2 12.7*  HCT 43.6 41.8 39.6  MCV 96.9 98.6 97.3  PLT 197 162 295   Basic Metabolic Panel: Recent Labs  Lab 12/20/17 1153 12/20/17  1818 12/21/17 0458 12/22/17 0428  NA 139  --  139 136  K 4.2  --  3.5 3.6  CL 101  --  106 103  CO2 28  --  24 26  GLUCOSE 122*  --  97 110*  BUN 27*  --  21* 14  CREATININE 1.79*  --  1.11 1.01  CALCIUM 9.4  --  8.3* 8.2*  MG  --  1.5*  --  1.8  PHOS  --  3.3  --   --    GFR: Estimated Creatinine Clearance: 79.1 mL/min (by C-G formula based on SCr of 1.01 mg/dL). Liver Function Tests: Recent Labs  Lab  12/20/17 1153 12/22/17 0428  AST 25 19  ALT 24 16*  ALKPHOS 53 47  BILITOT 1.7* 0.9  PROT 7.5 6.5  ALBUMIN 4.3 3.3*   No results for input(s): LIPASE, AMYLASE in the last 168 hours. No results for input(s): AMMONIA in the last 168 hours. Coagulation Profile: Recent Labs  Lab 12/20/17 1153  INR 1.04   Cardiac Enzymes: No results for input(s): CKTOTAL, CKMB, CKMBINDEX, TROPONINI in the last 168 hours. BNP (last 3 results) No results for input(s): PROBNP in the last 8760 hours. HbA1C: Recent Labs    12/20/17 1824  HGBA1C 5.4   CBG: No results for input(s): GLUCAP in the last 168 hours. Lipid Profile: Recent Labs    12/21/17 0458  CHOL 168  HDL 61  LDLCALC 91  TRIG 79  CHOLHDL 2.8   Thyroid Function Tests: Recent Labs    12/20/17 1725  TSH 0.717   Anemia Panel: Recent Labs    12/20/17 1726  VITAMINB12 211   Sepsis Labs: Recent Labs  Lab 12/20/17 1155 12/20/17 1417  LATICACIDVEN 2.42* 3.37*    Recent Results (from the past 240 hour(s))  Culture, blood (Routine x 2)     Status: None (Preliminary result)   Collection Time: 12/20/17 11:50 AM  Result Value Ref Range Status   Specimen Description BLOOD LEFT ARM DRAWN BY RN  Final   Special Requests   Final    BOTTLES DRAWN AEROBIC AND ANAEROBIC Blood Culture adequate volume   Culture   Final    NO GROWTH 2 DAYS Performed at [facility], [address], [place], [place] [address]    Report Status PENDING  Incomplete  Culture, blood (Routine x 2)     Status: None (Preliminary result)   Collection Time: 12/20/17 11:54 AM  Result Value Ref Range Status   Specimen Description BLOOD RIGHT ARM DRAWN BY RN  Final   Special Requests   Final    BOTTLES DRAWN AEROBIC AND ANAEROBIC Blood Culture adequate volume   Culture   Final    NO GROWTH 2 DAYS Performed at [facility], [address], [place], [place] [address]    Report Status PENDING  Incomplete  Culture, Urine     Status: None   Collection Time:  12/20/17  2:29 PM  Result Value Ref Range Status   Specimen Description   Final    URINE, CLEAN CATCH Performed at [facility], [address], [place], [place] [address]    Special Requests   Final    NONE Performed at [facility], [address] [place], [place] [address]    Culture   Final    NO GROWTH Performed at [facility], [place] [address], [place],  [address]    Report Status 12/22/2017 FINAL  Final  MRSA PCR  Screening     Status: None   Collection Time: 12/20/17  3:59 PM  Result Value Ref Range Status   MRSA by PCR NEGATIVE NEGATIVE Final    Comment:        The GeneXpert MRSA Assay (FDA approved for NASAL specimens only), is one component of a comprehensive MRSA colonization surveillance program. It is not intended to diagnose MRSA infection nor to guide or monitor treatment for MRSA infections. Performed at [facility], [address] [place], [place] [address]   Culture, blood (routine x 2)     Status: None (Preliminary result)   Collection Time: 12/22/17  8:15 AM  Result Value Ref Range Status   Specimen Description BLOOD LEFT ARM  Final   Special Requests   Final    BOTTLES DRAWN AEROBIC AND ANAEROBIC Blood Culture adequate volume Performed at [facility], [address], [place], [place] [address]    Culture PENDING  Incomplete   Report Status PENDING  Incomplete  Culture, blood (routine x 2)     Status: None (Preliminary result)   Collection Time: 12/22/17  8:23 AM  Result Value Ref Range Status   Specimen Description BLOOD RIGHT HAND  Final   Special Requests   Final    BOTTLES DRAWN AEROBIC ONLY Performed at [facility], [address], [place], [place] [address]    Culture PENDING  Incomplete   Report Status PENDING  Incomplete         Radiology Studies: Dg Chest Port 1 View  Result Date: 12/21/2017 CLINICAL DATA:  Hypoxia. EXAM: PORTABLE CHEST 1 VIEW COMPARISON:  Radiograph of Dec 20, 2017. FINDINGS: Stable mild  cardiomegaly is noted with central pulmonary vascular congestion. No pneumothorax or pleural effusion is noted. Both lungs are clear. The visualized skeletal structures are unremarkable. IMPRESSION: Stable mild cardiomegaly with central pulmonary vascular congestion. Electronically Signed   By: Marijo Conception, M.D.   On: 12/21/2017 16:06   Dg Chest Portable 1 View  Result Date: 12/20/2017 CLINICAL DATA:  Confusion, weakness low-grade fever, former smoking history EXAM: PORTABLE CHEST 1 VIEW COMPARISON:  Chest x-ray of 07/16/2017 FINDINGS: The lungs are not as well aerated but no pneumonia or effusion is seen. Leads overlie the right cardiophrenic angle. Mediastinal and hilar contours are stable and the heart is mildly enlarged and stable. No bony abnormality is seen. IMPRESSION: No active lung disease. Slightly diminished aeration. Stable mild cardiomegaly. Electronically Signed   By: Ivar Drape M.D.   On: 12/20/2017 12:15        Scheduled Meds: . cholecalciferol  2,000 Units Oral Daily  . donepezil  10 mg Oral Daily  . famotidine  20 mg Oral Daily  . gabapentin  300 mg Oral BID  . heparin  5,000 Units Subcutaneous Q8H  . primidone  100 mg Oral BID  . sodium chloride flush  3 mL Intravenous Q12H  . tamsulosin  0.4 mg Oral QPM   Continuous Infusions: . sodium chloride 100 mL/hr at 12/21/17 1825  . piperacillin-tazobactam (ZOSYN)  IV 3.375 g (12/22/17 1135)  . vancomycin Stopped (12/21/17 1443)     LOS: 2 days    Time spent: [place], MD [facility]   If 7PM-7AM, please contact night-coverage [email or web address] Password [facility] 12/22/2017, 12:08 PM

## 2017-12-22 NOTE — Care Management Note (Signed)
Case Management Note  Patient Details  Name: Lawrence Reynolds MRN: [id number] Date of Birth: Apr 07, 1941  Subjective/Objective:  Adm with sepsis. From home with wife. Reports ind with ADL's. No use of assistive devices.  Met with patient to discuss home health PT recommendation. No family at bedside. Patient declines. CM discussed checking back with patient prior to DC to confirm.                  Action/Plan: CM following for needs. CT ABD/pelvis pending.  Expected Discharge Date:   unk               Expected Discharge Plan:     In-House Referral:     Discharge planning Services  CM Consult  Post Acute Care Choice:    Choice offered to:     DME Arranged:    DME Agency:     HH Arranged:    HH Agency:     Status of Service:  In process, will continue to follow  If discussed at Long Length of Stay Meetings, dates discussed:    Additional Comments:  Aimee Timmons, [place]uncey [place], RN 12/22/2017, 11:56 AM

## 2017-12-23 ENCOUNTER — Inpatient Hospital Stay (HOSPITAL_COMMUNITY): Payer: Medicare HMO

## 2017-12-23 LAB — BASIC METABOLIC PANEL
Anion gap: 8 (ref 5–15)
CO2: 27 mmol/L (ref 22–32)
Glucose, Bld: 107 mg/dL — ABNORMAL HIGH (ref 65–99)
Potassium: 3.1 mmol/L — ABNORMAL LOW (ref 3.5–5.1)

## 2017-12-23 LAB — BASIC METABOLIC PANEL WITH GFR
BUN: 15 mg/dL (ref 6–20)
Calcium: 8.2 mg/dL — ABNORMAL LOW (ref 8.9–10.3)
Chloride: 101 mmol/L (ref 101–111)
Creatinine, Ser: 1.06 mg/dL (ref 0.61–1.24)
GFR calc Af Amer: >60 mL/min (ref >60)
GFR calc non Af Amer: >60 mL/min (ref >60)
Sodium: 136 mmol/L (ref 135–145)

## 2017-12-23 LAB — CBC
HCT: 39.6 % (ref 39.0–52.0)
Hemoglobin: 12.8 g/dL — ABNORMAL LOW (ref 13.0–17.0)
MCH: 31.1 pg (ref 26.0–34.0)
MCHC: 32.3 g/dL (ref 30.0–36.0)
MCV: 96.1 fL (ref 78.0–100.0)
Platelets: 154 K/uL (ref 150–400)
RBC: 4.12 MIL/uL — ABNORMAL LOW (ref 4.22–5.81)
RDW: 13.8 % (ref 11.5–15.5)
WBC: 7.4 K/uL (ref 4.0–10.5)

## 2017-12-23 LAB — TSH: TSH: 1.532 u[IU]/mL (ref 0.350–4.500)

## 2017-12-23 LAB — TROPONIN I
Troponin I: 0.04 ng/mL (ref <0.03)
Troponin I: 0.03 ng/mL (ref <0.03)

## 2017-12-23 LAB — PROCALCITONIN: Procalcitonin: 1.64 ng/mL

## 2017-12-23 MED ORDER — METOPROLOL TARTRATE 25 MG PO TABS
12.5000 mg | ORAL_TABLET | Freq: Two times a day (BID) | ORAL | Status: DC
  Administered 2017-12-23 – 2017-12-24 (×3): 12.5 mg via ORAL
  Filled 2017-12-23 (×3): qty 1

## 2017-12-23 MED ORDER — APIXABAN 5 MG PO TABS
5.0000 mg | ORAL_TABLET | Freq: Two times a day (BID) | ORAL | Status: DC
  Administered 2017-12-24: 5 mg via ORAL
  Filled 2017-12-23: qty 1

## 2017-12-23 NOTE — Progress Notes (Signed)
PROGRESS NOTE    Lawrence Reynolds  KXF:[id number] DOB: 11-Sep-1940 DOA: 12/20/2017 PCP: Monico Blitz, MD    Brief Narrative:  77 year old with past medical history relevant for obesity, benign prostatic hypertrophy, obstructive sleep apnea with no CPAP, dementia, essential tremor, reflux who was admitted to the emergency department with altered mental status, fever and weakness found to have sepsis.   Assessment & Plan:   Principal Problem:   Sepsis ([place]) Active Problems:   HLD (hyperlipidemia)   SLEEP APNEA, OBSTRUCTIVE, MODERATE   Essential tremor   GERD   BPH (benign prostatic hyperplasia)   Dementia   #) Sepsis from unclear source: CT abdomen pelvis showed only enlarged prostate with possible developing lower lobe pneumonia however chest x-ray is only continue to show pulmonary vascular congestion.  His procalcitonin is quite elevated.  Clinically the patient is getting better with less fevers and clinically he is feeling better. -blood cultures ordered 12/20/2017 no growth to date  - blood cultures ordered from 12/22/2017 no growth to date - urine culture ordered 12/20/2017 negative -Continue IV vancomycin and Zosyn started 12/20/2017 -We will transfer out of the ICU today  #) Paroxysmal atrial fibrillation: Yesterday patient began to have runs of paroxysmal atrial fibrillation with heart rate in the 140s.  These would self terminate.  He did not have any chest pain during that time. -Troponin was only 0.04 -Discussed with family about anticoagulation, family will come back and give answer today - We will consider starting metoprolol tartrate 12.5 mg twice daily today -Echo showed normal EF with no diastolic dysfunction -Outpatient cardiology follow-up -TSH ordered  #) Headache: Patient was complaining of headache yesterday.  He did not have any focal neurological deficits.  He did not have any neck stiffness or meningismus.  He apparently carries a history of severe headaches  of unclear etiology that is been evaluated as an outpatient per his family. -PRN Fioricet  #) Altered mental status/dementia: Resolved -Continue donepezil 10 mg daily  #) BPH: -Continue tamsulosin 0.4 mg nightly  #) Essential tremor: -Continue primidone 100 mg twice daily  #) Pain/psych: -Continue gabapentin 300 mg twice daily  Fluids: Gentle IV fluids Electrolyte: Monitor and supplement Nutrition: Regular diet  Prophylaxis: Subcu heparin  Disposition: Pending resolution of sepsis and underlying source  Full code   Consultants:   None  Procedures:  Echo 12/22/2017: - Left ventricle: The cavity size was normal. Wall thickness was   increased in a pattern of mild LVH. Systolic function was normal.   The estimated ejection fraction was in the range of 60% to 65%.   Wall motion was normal; there were no regional wall motion   abnormalities. Left ventricular diastolic function parameters   were normal.  - Atrial septum: No defect or patent foramen ovale was identified.  Antimicrobials:   IV vancomycin and Zosyn started 12/20/2017   Subjective: Patient reports he is doing significantly better than yesterday.  His respiratory status is improved dramatically.  His last fever was approximately 8 PM yesterday.  He did have a brief episode of atrial fibrillation with RVR which is new for him.  An echo was ordered which was unremarkable and remaining labs are on unremarkable.  He otherwise denies any chest pain, shortness of breath, nausea, vomiting, diarrhea, cough, congestion.  He reports his headache is much improved on the Fioricet.  Objective: Vitals:   12/23/17 0600 12/23/17 0700 12/23/17 0730 12/23/17 0737  BP: (!) 150/97     Pulse: 87 75 82  77  Resp: 20 17 15 17   Temp:   98.6 F (37 C)   TempSrc:   Oral   SpO2: 98% 95% 95% 97%  Weight:      Height:        Intake/Output Summary (Last 24 hours) at 12/23/2017 0944 Last data filed at 12/23/2017 0600 Gross per 24  hour  Intake 3890 ml  Output 1900 ml  Net 1990 ml   Filed Weights   12/21/17 0500 12/22/17 0500 12/23/17 0500  Weight: 121.7 kg (268 lb 4.8 oz) 122.1 kg (269 lb 2.9 oz) 121.7 kg (268 lb 4.8 oz)    Examination:  General exam: No acute distress Respiratory system: No increased work of breathing, diminished lung sounds at bases, scattered rhonchi, no wheezes Cardiovascular system: Distant heart sounds, regular rate and rhythm, no murmurs Gastrointestinal system: Abdomen is nondistended, soft and nontender. No organomegaly or masses felt. Normal bowel sounds heard. Central nervous system: Grossly intact, moving all extremities Extremities: 1+ lower extremity edema Skin: No rashes on visible skin Psychiatry: Judgement and insight appear normal. Mood & affect appropriate.     Data Reviewed: I have personally reviewed following labs and imaging studies  CBC: Recent Labs  Lab 12/20/17 1153 12/21/17 0458 12/22/17 0428 12/23/17 0428  WBC 37.0* 27.0* 19.0* 7.4  NEUTROABS 31.8*  --   --   --   HGB 14.3 13.2 12.7* 12.8*  HCT 43.6 41.8 39.6 39.6  MCV 96.9 98.6 97.3 96.1  PLT 197 162 163 989   Basic Metabolic Panel: Recent Labs  Lab 12/20/17 1153 12/20/17 1818 12/21/17 0458 12/22/17 0428 12/23/17 0428  NA 139  --  139 136 136  K 4.2  --  3.5 3.6 3.1*  CL 101  --  106 103 101  CO2 28  --  24 26 27   GLUCOSE 122*  --  97 110* 107*  BUN 27*  --  21* 14 15  CREATININE 1.79*  --  1.11 1.01 1.06  CALCIUM 9.4  --  8.3* 8.2* 8.2*  MG  --  1.5*  --  1.8  --   PHOS  --  3.3  --   --   --    GFR: Estimated Creatinine Clearance: 75.2 mL/min (by C-G formula based on SCr of 1.06 mg/dL). Liver Function Tests: Recent Labs  Lab 12/20/17 1153 12/22/17 0428  AST 25 19  ALT 24 16*  ALKPHOS 53 47  BILITOT 1.7* 0.9  PROT 7.5 6.5  ALBUMIN 4.3 3.3*   No results for input(s): LIPASE, AMYLASE in the last 168 hours. No results for input(s): AMMONIA in the last 168 hours. Coagulation  Profile: Recent Labs  Lab 12/20/17 1153  INR 1.04   Cardiac Enzymes: Recent Labs  Lab 12/22/17 1553 12/22/17 2236 12/23/17 0428  TROPONINI <0.03 0.04* 0.03*   BNP (last 3 results) No results for input(s): PROBNP in the last 8760 hours. HbA1C: Recent Labs    12/20/17 1824  HGBA1C 5.4   CBG: No results for input(s): GLUCAP in the last 168 hours. Lipid Profile: Recent Labs    12/21/17 0458  CHOL 168  HDL 61  LDLCALC 91  TRIG 79  CHOLHDL 2.8   Thyroid Function Tests: Recent Labs    12/20/17 1725  TSH 0.717   Anemia Panel: Recent Labs    12/20/17 1726  VITAMINB12 211   Sepsis Labs: Recent Labs  Lab 12/20/17 1155 12/20/17 1417 12/22/17 1553 12/23/17 0428  PROCALCITON  --   --  1.93 1.64  LATICACIDVEN 2.42* 3.37*  --   --     Recent Results (from the past 240 hour(s))  Culture, blood (Routine x 2)     Status: None (Preliminary result)   Collection Time: 12/20/17 11:50 AM  Result Value Ref Range Status   Specimen Description BLOOD LEFT ARM DRAWN BY RN  Final   Special Requests   Final    BOTTLES DRAWN AEROBIC AND ANAEROBIC Blood Culture adequate volume   Culture   Final    NO GROWTH 3 DAYS Performed at [facility], [address] [place], [place] [address]    Report Status PENDING  Incomplete  Culture, blood (Routine x 2)     Status: None (Preliminary result)   Collection Time: 12/20/17 11:54 AM  Result Value Ref Range Status   Specimen Description BLOOD RIGHT ARM DRAWN BY RN  Final   Special Requests   Final    BOTTLES DRAWN AEROBIC AND ANAEROBIC Blood Culture adequate volume   Culture   Final    NO GROWTH 3 DAYS Performed at [facility], [address], [place], [place] [address]    Report Status PENDING  Incomplete  Culture, Urine     Status: None   Collection Time: 12/20/17  2:29 PM  Result Value Ref Range Status   Specimen Description   Final    URINE, CLEAN CATCH Performed at [facility], [address], [place], [place] [address]      Special Requests   Final    NONE Performed at [facility], [address] [place], [place] [address]    Culture   Final    NO GROWTH Performed at [facility], [place] [address], [place], [place] [address]    Report Status 12/22/2017 FINAL  Final  MRSA PCR Screening     Status: None   Collection Time: 12/20/17  3:59 PM  Result Value Ref Range Status   MRSA by PCR NEGATIVE NEGATIVE Final    Comment:        The GeneXpert MRSA Assay (FDA approved for NASAL specimens only), is one component of a comprehensive MRSA colonization surveillance program. It is not intended to diagnose MRSA infection nor to guide or monitor treatment for MRSA infections. Performed at [facility], [address] [place], [place] [address]   Culture, blood (routine x 2)     Status: None (Preliminary result)   Collection Time: 12/22/17  8:15 AM  Result Value Ref Range Status   Specimen Description BLOOD LEFT ARM  Final   Special Requests   Final    BOTTLES DRAWN AEROBIC AND ANAEROBIC Blood Culture adequate volume   Culture   Final    NO GROWTH < 24 HOURS Performed at [facility], [address] [place], [place] [address]    Report Status PENDING  Incomplete  Culture, blood (routine x 2)     Status: None (Preliminary result)   Collection Time: 12/22/17  8:23 AM  Result Value Ref Range Status   Specimen Description BLOOD RIGHT HAND  Final   Special Requests BOTTLES DRAWN AEROBIC ONLY  Final   Culture   Final    NO GROWTH < 24 HOURS Performed at [facility], [address] [place], [place] [address]    Report Status PENDING  Incomplete         Radiology Studies: Ct Abdomen Pelvis W Contrast  Result Date: 12/22/2017 CLINICAL DATA:  Generalized weakness, fever, tachycardia, headaches, dementia EXAM: CT ABDOMEN  AND PELVIS WITH CONTRAST TECHNIQUE: Multidetector CT imaging of the abdomen and pelvis was performed using the standard protocol following bolus administration of intravenous  contrast. CONTRAST:  132mL ISOVUE-300 IOPAMIDOL (ISOVUE-300) INJECTION 61%, 39mL ISOVUE-300 IOPAMIDOL (ISOVUE-300) INJECTION 61% COMPARISON:  CT abdomen and pelvis of 10/14/2013 FINDINGS: Lower chest: Probable atelectasis is noted posterior in the left lower lobe although developing pneumonia cannot be excluded the heart is mildly enlarged and stable and coronary artery calcifications are noted diffusely Hepatobiliary: The liver enhances with no focal abnormality and no ductal dilatation is seen. Surgical clips are present from prior cholecystectomy. Pancreas: The pancreas is normal in size. However there are 2 low-attenuation rounded lesions associated with the pancreas both of which are within the uncinate process. One on image 50 series 2 measures 2.0 cm with attenuation of 9 HU. A second is seen on image 55 series 2 measuring 2.1 cm with attenuation of 17 HU. 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 Both are associated with a single calcification again most typical of prior pancreatitis with a few calcifications scattered throughout the remainder of the pancreatic parenchyma as well. No pancreatic ductal dilatation is seen. Spleen: The spleen is unremarkable. Adrenals/Urinary Tract: The adrenal glands appear stable. The kidneys are unremarkable with slight increase in size of a right medial upper pole cyst of 2.6 cm with attenuation of 6 HU. 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 On delayed images, pelvocaliceal systems are unremarkable. The ureters appear normal in caliber. The urinary bladder is not well distended but no abnormality is seen. Stomach/Bowel: The stomach is not well distended but no abnormality is seen. No small bowel abnormality is noted. There are only a few scattered colon diverticula noted. Moderate amount  of feces is noted throughout the colon. Terminal ileum is unremarkable. The appendix is not definitely seen but no inflammatory process is noted. Vascular/Lymphatic: The abdominal aorta is normal in caliber with moderate abdominal aortic atherosclerosis. No adenopathy is seen. Reproductive: Prostate is slightly prominent measuring 4.8 x 5.7 cm. Other: None. Musculoskeletal: The lumbar vertebrae are in normal alignment with relatively normal intervertebral disc spaces. Diffuse anterior osteophyte formation is noted. Mild degenerative facet changes also are noted involving the hips and the SI joints. IMPRESSION: 1. Focal atelectasis or developing pneumonia within the left lower lobe posteriorly. 2. Two slightly larger rounded low-attenuation structures in the uncinate process of the pancreas of 2.1 and 2.0 cm, which have been present previously, most consistent with incidental pseudocysts. 3. Moderate abdominal aortic atherosclerosis. Coronary artery calcifications. Electronically Signed   By: Ivar Drape M.D.   On: 12/22/2017 14:43   Dg Chest Port 1 View  Result Date: 12/23/2017 CLINICAL DATA:  Hypoxia. EXAM: PORTABLE CHEST 1 VIEW COMPARISON:  Radiograph of Dec 21, 2017. FINDINGS: Stable cardiomegaly with mild central pulmonary vascular congestion. No pneumothorax or pleural effusion is noted. Both lungs are clear. The visualized skeletal structures are unremarkable. IMPRESSION: Stable cardiomegaly with mild central pulmonary vascular congestion. Electronically Signed   By: Marijo Conception, M.D.   On: 12/23/2017 08:38   Dg Chest Port 1 View  Result Date: 12/21/2017 CLINICAL DATA:  Hypoxia. EXAM: PORTABLE CHEST 1 VIEW COMPARISON:  Radiograph of Dec 20, 2017. FINDINGS: Stable mild cardiomegaly is noted with central pulmonary vascular congestion. No pneumothorax or pleural effusion is noted. Both lungs are  clear. The visualized skeletal structures are unremarkable. IMPRESSION: Stable mild cardiomegaly with  central pulmonary vascular congestion. Electronically Signed   By: Marijo Conception, M.D.   On: 12/21/2017 16:06        Scheduled Meds: . cholecalciferol  2,000 Units Oral Daily  . donepezil  10 mg Oral Daily  . famotidine  20 mg Oral Daily  . gabapentin  300 mg Oral BID  . heparin  5,000 Units Subcutaneous Q8H  . primidone  100 mg Oral BID  . sodium chloride flush  3 mL Intravenous Q12H  . tamsulosin  0.4 mg Oral QPM   Continuous Infusions: . sodium chloride 100 mL/hr at 12/22/17 1725  . piperacillin-tazobactam (ZOSYN)  IV Stopped (12/23/17 0831)  . vancomycin Stopped (12/22/17 1612)     LOS: 3 days    Time spent: [place], MD [facility]   If 7PM-7AM, please contact night-coverage [email or web address] Password [facility] 12/23/2017, 9:44 AM

## 2017-12-23 NOTE — Care Management Important Message (Signed)
Important Message  Patient Details  Name: LAM MCCUBBINS MRN: [id number] Date of Birth: [birth year]   Medicare Important Message Given:  Yes    Shelda Altes 12/23/2017, 12:12 PM

## 2017-12-23 NOTE — Progress Notes (Signed)
Trop 0.04 called by lab. MD notified via text page

## 2017-12-23 NOTE — Progress Notes (Signed)
Physical Therapy Treatment Patient Details Name: Lawrence Reynolds MRN: [id number] DOB: 12/02/1940 Today's Date: 12/23/2017    History of Present Illness Lawrence Reynolds is a 77 year old male with past medical history significant for obesity, BPH, obstructive sleep apnea (no using CPAP), chronic history of intermittent headaches, dementia, GERD and essential tremor; who presented to the emergency department secondary to worsening mental status changes, weakness and fever.  Patient was too confused to participate much on the history at this moment.  Patient's wife was the one providing history; she reported that he has been in his usual health of state, also very early morning of admission when she noted that he was more confused than at baseline, felt that he was warm to touch and was experiencing increased weakness throughout his whole body (unable to get up from the commode).  Patient wife reported that she check his temperature and it was around 101.  Estrone over in the urine has been reported and also noticed some dark yellow appearance.  There has not been any productive cough, shortness of breath, nausea, vomiting, abdominal pain, chest pain, difficulty breathing, diarrhea, melena, hematochezia, hematuria or any other complaints.    PT Comments    Patient demonstrates improvement for sitting up at bedside requiring less assistance, slightly unsteady on feet without use of assistive device, has to walk with slow guarded cadence when not using an AD, limited secondary to fatigue and mild difficulty breathing.  Patient tolerated sitting up in chair after therapy and encouraged to use his RW when he returns home.  Patient will benefit from continued physical therapy in hospital and recommended venue below to increase strength, balance, endurance for safe ADLs and gait.   Follow Up Recommendations  Home health PT     Equipment Recommendations  None recommended by PT    Recommendations for  Other Services       Precautions / Restrictions Precautions Precautions: Fall Restrictions Weight Bearing Restrictions: No    Mobility  Bed Mobility Overal bed mobility: Needs Assistance Bed Mobility: Supine to Sit     Supine to sit: Supervision     General bed mobility comments: requires less assistance for sitting up  Transfers Overall transfer level: Needs assistance Equipment used: None Transfers: Sit to/from Stand;Stand Pivot Transfers Sit to Stand: Supervision Stand pivot transfers: Supervision       General transfer comment: labored movement  Ambulation/Gait Ambulation/Gait assistance: Min guard Ambulation Distance (Feet): 100 Feet Assistive device: None Gait Pattern/deviations: Decreased step length - right;Decreased step length - left;Decreased stride length Gait velocity: slow   General Gait Details: demonstrates slow slightly labored cadence with occasional leaning on nearby objects for support, slightly unsteady on feet with 2 near LOB during gait training   Stairs             Wheelchair Mobility    Modified Rankin (Stroke Patients Only)       Balance Overall balance assessment: Mild deficits observed, not formally tested                                          Cognition   Behavior During Therapy: [facility] for tasks assessed/performed Overall Cognitive Status: Within Functional Limits for tasks assessed  Exercises General Exercises - Lower Extremity Long Arc Quad: Seated;AROM;Strengthening;Both;10 reps Straight Leg Raises: Seated;Strengthening;Both;10 reps;AROM Hip Flexion/Marching: Seated;AROM;Strengthening;Both;10 reps    General Comments        Pertinent Vitals/Pain      Home Living                      Prior Function            PT Goals (current goals can now be found in the care plan section) Acute Rehab PT Goals Patient Stated Goal:  return home PT Goal Formulation: With patient/family Time For Goal Achievement: 12/27/17 Potential to Achieve Goals: Good Progress towards PT goals: Progressing toward goals    Frequency    Min 3X/week      PT Plan Current plan remains appropriate    Co-evaluation              AM-PAC PT "6 Clicks" Daily Activity  Outcome Measure  Difficulty turning over in bed (including adjusting bedclothes, sheets and blankets)?: None Difficulty moving from lying on back to sitting on the side of the bed? : None Difficulty sitting down on and standing up from a chair with arms (e.g., wheelchair, bedside commode, etc,.)?: None Help needed moving to and from a bed to chair (including a wheelchair)?: None Help needed walking in hospital room?: A Little Help needed climbing 3-5 steps with a railing? : A Little 6 Click Score: 22    End of Session Equipment Utilized During Treatment: Gait belt Activity Tolerance: Patient tolerated treatment well;Patient limited by fatigue Patient left: in chair;with call bell/phone within reach;with family/visitor present Nurse Communication: Mobility status;Other (comment)(RN notified that patient left up in chair) PT Visit Diagnosis: Unsteadiness on feet (R26.81);Other abnormalities of gait and mobility (R26.89);Muscle weakness (generalized) (M62.81)     Time: 1696-7893 PT Time Calculation (min) (ACUTE ONLY): 27 min  Charges:  $Gait Training: 8-22 mins $Therapeutic Exercise: 8-22 mins                    G Codes:       2:08 PM, 12/26/17 Lonell Grandchild, MPT Physical Therapist with [facility] [phone] office [phone] mobile phone

## 2017-12-23 NOTE — Progress Notes (Signed)
1145 Patient transferring to Dept 300 room 334, report given to receiving nurse Val, LPN.

## 2017-12-24 DIAGNOSIS — I480 Paroxysmal atrial fibrillation: Secondary | ICD-10-CM | POA: Diagnosis not present

## 2017-12-24 DIAGNOSIS — G43009 Migraine without aura, not intractable, without status migrainosus: Secondary | ICD-10-CM

## 2017-12-24 DIAGNOSIS — N179 Acute kidney failure, unspecified: Secondary | ICD-10-CM | POA: Diagnosis present

## 2017-12-24 DIAGNOSIS — G43909 Migraine, unspecified, not intractable, without status migrainosus: Secondary | ICD-10-CM | POA: Diagnosis present

## 2017-12-24 DIAGNOSIS — E538 Deficiency of other specified B group vitamins: Secondary | ICD-10-CM | POA: Diagnosis present

## 2017-12-24 LAB — BASIC METABOLIC PANEL
Anion gap: 7 (ref 5–15)
BUN: 12 mg/dL (ref 6–20)
CO2: 28 mmol/L (ref 22–32)
Calcium: 8.2 mg/dL — ABNORMAL LOW (ref 8.9–10.3)
Chloride: 103 mmol/L (ref 101–111)
Creatinine, Ser: 0.98 mg/dL (ref 0.61–1.24)
Glucose, Bld: 92 mg/dL (ref 65–99)

## 2017-12-24 LAB — CBC
HCT: 40.2 % (ref 39.0–52.0)
Hemoglobin: 13 g/dL (ref 13.0–17.0)
MCH: 30.8 pg (ref 26.0–34.0)
MCHC: 32.3 g/dL (ref 30.0–36.0)
MCV: 95.3 fL (ref 78.0–100.0)
Platelets: 184 K/uL (ref 150–400)
RBC: 4.22 MIL/uL (ref 4.22–5.81)
RDW: 13.7 % (ref 11.5–15.5)
WBC: 7.1 K/uL (ref 4.0–10.5)

## 2017-12-24 LAB — BASIC METABOLIC PANEL WITH GFR
GFR calc Af Amer: >60 mL/min (ref >60)
GFR calc non Af Amer: >60 mL/min (ref >60)
Potassium: 3.1 mmol/L — ABNORMAL LOW (ref 3.5–5.1)
Sodium: 138 mmol/L (ref 135–145)

## 2017-12-24 LAB — PROCALCITONIN: Procalcitonin: 0.87 ng/mL

## 2017-12-24 MED ORDER — LEVOFLOXACIN 750 MG PO TABS
750.0000 mg | ORAL_TABLET | Freq: Every day | ORAL | Status: DC
  Administered 2017-12-24: 750 mg via ORAL
  Filled 2017-12-24: qty 1

## 2017-12-24 MED ORDER — ASPIRIN EC 81 MG PO TBEC: 81.0000 mg | DELAYED_RELEASE_TABLET | Freq: Every day | ORAL | 0 refills | Status: AC

## 2017-12-24 MED ORDER — BUTALBITAL-APAP-CAFFEINE 50-325-40 MG PO TABS: 1.0000 | ORAL_TABLET | Freq: Four times a day (QID) | ORAL | 0 refills | Status: DC | PRN

## 2017-12-24 MED ORDER — LEVOFLOXACIN 750 MG PO TABS: 750.0000 mg | ORAL_TABLET | Freq: Every day | ORAL | 0 refills | Status: DC

## 2017-12-24 MED ORDER — POTASSIUM CHLORIDE CRYS ER 20 MEQ PO TBCR
40.0000 meq | EXTENDED_RELEASE_TABLET | Freq: Once | ORAL | Status: AC
Start: 2017-12-24 — End: 2017-12-24
  Administered 2017-12-24: 40 meq via ORAL
  Filled 2017-12-24: qty 2

## 2017-12-24 MED ORDER — VITAMIN B-12 1000 MCG PO TABS: 1000.0000 ug | ORAL_TABLET | Freq: Every day | ORAL | 0 refills | Status: AC

## 2017-12-24 NOTE — Care Management Note (Signed)
Case Management Note  Patient Details  Name: Lawrence Reynolds MRN: [id number] Date of Birth: 02-11-1941  Subjective/Objective:                 Spoke w patient and wife. They would like to use [facility] for [facility] services. They will be moving in to daughter's house at [address] [place] [address] tomorrow. Please use phone [phone]. Referral accepted by [facility], no further CM needs.    Action/Plan:   Expected Discharge Date:  12/24/17               Expected Discharge Plan:  [place]  In-House Referral:     Discharge planning Services  CM Consult  Post Acute Care Choice:  Home Health Choice offered to:  Patient, Spouse  DME Arranged:    DME Agency:     HH Arranged:  PT [place]:  [place]  Status of Service:  Completed, signed off  If discussed at [place] of Stay Meetings, dates discussed:    Additional Comments:  Carles Collet, RN 12/24/2017, 11:27 AM

## 2017-12-24 NOTE — Discharge Summary (Signed)
Physician Discharge Summary  Lawrence Reynolds QIO:[id number] DOB: 26-Sep-1940 DOA: 12/20/2017  PCP: Monico Blitz, MD  Admit date: 12/20/2017 Discharge date: 12/24/2017  Admitted From: Home Disposition: Home  Recommendations for Outpatient Follow-up:  1. Follow up with PCP in 1-2 weeks.  Complete 10-day course of antibiotic on 5/30.   Home Health: PT Equipment/Devices:None  Discharge Condition: Fair CODE STATUS: Full code Diet recommendation: Heart Healthy    Discharge Diagnoses:  Principal Problem:   Sepsis ([place])  Active Problems:   Paroxysmal atrial fibrillation (HCC)   Migraine headache   AKI (acute kidney injury) ([place])   B12 deficiency    HLD (hyperlipidemia)   SLEEP APNEA, OBSTRUCTIVE, MODERATE   Essential tremor   GERD   BPH (benign prostatic hyperplasia)   Dementia  Brief narrative/HPI Please refer to admission H&P for details, in brief, 77 year old obese male with history of BPH, OSA not on CPAP, dementia (mild to moderate), essential tremor,?  Migraine headaches presented to the ED with acute change in mental status associated with fever, weakness and found to have sepsis.  Hospital course Sepsis [facility]) Source unclear.  Possible for early lobar pneumonia as seen on CT abdomen and pelvis.  Patient was placed on empiric vancomycin and Zosyn.  Blood cultures, UA and urine culture on admission were negative for growth. Patient no longer septic, has low-grade fever.  Symptoms much improved and mental status back to baseline. I will discharge him on oral Levaquin to complete 10-day course of antibiotics.  Paroxysmal atrial fibrillation Found on 5/23 with heart rate up to 140s.  Asymptomatic.  I suspect this is associated with his sepsis.  Had mildly elevated troponin peaked at 0.04, normal TSH and 2D echo with normal EF and no wall motion abnormality. Patient is currently on sinus rhythm.  His CHADS2 vasc score  is 2 (based on age only).  He does not need  anticoagulation and I will place him on baby aspirin.  Also does not need beta-blocker at present. Follow-up with PCP  Hypokalemia/hypomagnesemia Replenished.  Acute toxic metabolic encephalopathy Associated with sepsis and dehydration.  Now resolved and mental status at baseline mild to moderate dementia.  Normal TSH.  Continue Aricept.  Still has low B12 (211) and will be replenished.  Acute tubular necrosis (HCC) Secondary to sepsis use of NSAIDs/BC powder.  Resolved with IV fluids.  Avoid NSAIDs.  Possible migraine headaches Daughter reports that patient has ongoing off-and-on headaches for which she takes [facility] powder as well as ibuprofen.  Symptoms much better after Fioricet started and I will write him on prescription for short duration.  BPH Continue Flomax.  Essential tremor Continue primidone  Generalized weakness Seen by PT and recommends home health PT  Procedure: CT head, 2D echo  Consults: None Disposition: Home Family communication:Wife and daughter at bedside  Discharge Instructions   Allergies as of 12/24/2017   No Known Allergies     Medication List    STOP taking these medications   BC HEADACHE POWDER PO   ibuprofen 800 MG tablet Commonly known as:  ADVIL,MOTRIN     TAKE these medications   aspirin EC 81 MG tablet Take 1 tablet (81 mg total) by mouth daily.   butalbital-acetaminophen-caffeine 50-325-40 MG tablet Commonly known as:  FIORICET, ESGIC Take 1 tablet by mouth every 6 (six) hours as needed for headache.   cholecalciferol 1000 units tablet Commonly known as:  VITAMIN D Take 2,000 Units by mouth daily.   donepezil 10 MG tablet Commonly known  as:  ARICEPT Take 1 tablet by mouth daily.   EPINEPHrine 0.3 mg/0.3 mL Soaj injection Commonly known as:  EPIPEN Inject 1 mL (1 mg total) into the muscle once.   gabapentin 300 MG capsule Commonly known as:  NEURONTIN Take 300 mg by mouth 2 (two) times daily.   levofloxacin 750 MG  tablet Commonly known as:  LEVAQUIN Take 1 tablet (750 mg total) by mouth daily.   primidone 50 MG tablet Commonly known as:  MYSOLINE Take 100 mg by mouth 2 (two) times daily.   tamsulosin 0.4 MG Caps capsule Commonly known as:  FLOMAX Take 0.4 mg by mouth every evening.   triamcinolone cream 0.1 % Commonly known as:  KENALOG Apply 1 application topically daily as needed.   vitamin B-12 1000 MCG tablet Commonly known as:  CYANOCOBALAMIN Take 1 tablet (1,000 mcg total) by mouth daily.   VITAMIN E PO Take 1 tablet by mouth daily.      Follow-up Information    Monico Blitz, MD. Schedule an appointment as soon as possible for a visit in 1 week(s).   Specialty:  Internal Medicine Contact information: [place] [address] [phone]          No Known Allergies    Procedures/Studies: Ct Head Wo Contrast  Result Date: 12/23/2017 CLINICAL DATA:  Headache. EXAM: CT HEAD WITHOUT CONTRAST TECHNIQUE: Contiguous axial images were obtained from the base of the skull through the vertex without intravenous contrast. COMPARISON:  CT scan of January 18, 2014. FINDINGS: Brain: Mild chronic ischemic white matter disease is noted. No mass effect or midline shift is noted. Ventricular size is within normal limits. There is no evidence of mass lesion, hemorrhage or acute infarction. Vascular: No hyperdense vessel or unexpected calcification. Skull: Normal. Negative for fracture or focal lesion. Sinuses/Orbits: No acute finding. Other: None. IMPRESSION: Mild chronic ischemic white matter disease. No acute intracranial abnormality seen. Electronically Signed   By: Marijo Conception, M.D.   On: 12/23/2017 11:50   Ct Abdomen Pelvis W Contrast  Result Date: 12/22/2017 CLINICAL DATA:  Generalized weakness, fever, tachycardia, headaches, dementia EXAM: CT ABDOMEN AND PELVIS WITH CONTRAST TECHNIQUE: Multidetector CT imaging of the abdomen and pelvis was performed using the standard protocol  following bolus administration of intravenous contrast. CONTRAST:  116mL ISOVUE-300 IOPAMIDOL (ISOVUE-300) INJECTION 61%, 4mL ISOVUE-300 IOPAMIDOL (ISOVUE-300) INJECTION 61% COMPARISON:  CT abdomen and pelvis of 10/14/2013 FINDINGS: Lower chest: Probable atelectasis is noted posterior in the left lower lobe although developing pneumonia cannot be excluded the heart is mildly enlarged and stable and coronary artery calcifications are noted diffusely Hepatobiliary: The liver enhances with no focal abnormality and no ductal dilatation is seen. Surgical clips are present from prior cholecystectomy. Pancreas: The pancreas is normal in size. However there are 2 low-attenuation rounded lesions associated with the pancreas both of which are within the uncinate process. One on image 50 series 2 measures 2.0 cm with attenuation of 9 HU. A second is seen on image 55 series 2 measuring 2.1 cm with attenuation of 17 HU. 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 Both are associated with a single calcification again most typical of prior pancreatitis with a few calcifications scattered throughout the remainder of the pancreatic parenchyma as well. No pancreatic ductal dilatation is seen. Spleen: The spleen is unremarkable. Adrenals/Urinary Tract: The adrenal glands appear stable. The kidneys are unremarkable with slight increase in size  of a right medial upper pole cyst of 2.6 cm with attenuation of 6 HU. 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 On delayed images, pelvocaliceal systems are unremarkable. The ureters appear normal in caliber. The urinary bladder is not well distended but no abnormality is seen. Stomach/Bowel: The stomach is not well distended but no abnormality is seen. No small bowel abnormality is noted. There are only a few  scattered colon diverticula noted. Moderate amount of feces is noted throughout the colon. Terminal ileum is unremarkable. The appendix is not definitely seen but no inflammatory process is noted. Vascular/Lymphatic: The abdominal aorta is normal in caliber with moderate abdominal aortic atherosclerosis. No adenopathy is seen. Reproductive: Prostate is slightly prominent measuring 4.8 x 5.7 cm. Other: None. Musculoskeletal: The lumbar vertebrae are in normal alignment with relatively normal intervertebral disc spaces. Diffuse anterior osteophyte formation is noted. Mild degenerative facet changes also are noted involving the hips and the SI joints. IMPRESSION: 1. Focal atelectasis or developing pneumonia within the left lower lobe posteriorly. 2. Two slightly larger rounded low-attenuation structures in the uncinate process of the pancreas of 2.1 and 2.0 cm, which have been present previously, most consistent with incidental pseudocysts. 3. Moderate abdominal aortic atherosclerosis. Coronary artery calcifications. Electronically Signed   By: Ivar Drape M.D.   On: 12/22/2017 14:43   Dg Chest Port 1 View  Result Date: 12/23/2017 CLINICAL DATA:  Hypoxia. EXAM: PORTABLE CHEST 1 VIEW COMPARISON:  Radiograph of Dec 21, 2017. FINDINGS: Stable cardiomegaly with mild central pulmonary vascular congestion. No pneumothorax or pleural effusion is noted. Both lungs are clear. The visualized skeletal structures are unremarkable. IMPRESSION: Stable cardiomegaly with mild central pulmonary vascular congestion. Electronically Signed   By: Marijo Conception, M.D.   On: 12/23/2017 08:38   Dg Chest Port 1 View  Result Date: 12/21/2017 CLINICAL DATA:  Hypoxia. EXAM: PORTABLE CHEST 1 VIEW COMPARISON:  Radiograph of Dec 20, 2017. FINDINGS: Stable mild cardiomegaly is noted with central pulmonary vascular congestion. No pneumothorax or pleural effusion is noted. Both lungs are clear. The visualized skeletal structures are  unremarkable. IMPRESSION: Stable mild cardiomegaly with central pulmonary vascular congestion. Electronically Signed   By: Marijo Conception, M.D.   On: 12/21/2017 16:06   Dg Chest Portable 1 View  Result Date: 12/20/2017 CLINICAL DATA:  Confusion, weakness low-grade fever, former smoking history EXAM: PORTABLE CHEST 1 VIEW COMPARISON:  Chest x-ray of 07/16/2017 FINDINGS: The lungs are not as well aerated but no pneumonia or effusion is seen. Leads overlie the right cardiophrenic angle. Mediastinal and hilar contours are stable and the heart is mildly enlarged and stable. No bony abnormality is seen. IMPRESSION: No active lung disease. Slightly diminished aeration. Stable mild cardiomegaly. Electronically Signed   By: Ivar Drape M.D.   On: 12/20/2017 12:15    2D echo Study Conclusions  - Left ventricle: The cavity size was normal. Wall thickness was   increased in a pattern of mild LVH. Systolic function was normal.   The estimated ejection fraction was in the range of 60% to 65%.   Wall motion was normal; there were no regional wall motion   abnormalities. Left ventricular diastolic function parameters   were normal. - Atrial septum: No defect or patent foramen ovale was identified.   Subjective: Low-grade fever overnight.  No other symptoms.  Heart rate stable on the monitor.  Discharge Exam: Vitals:  12/24/17 0841 12/24/17 0958  BP: 132/89 126/63  Pulse: 64 71  Resp: 18 18  Temp: 98.8 F (37.1 C) 99.9 F (37.7 C)  SpO2: 95% 95%   Vitals:   12/23/17 2100 12/24/17 0719 12/24/17 0841 12/24/17 0958  BP: 128/81 127/74 132/89 126/63  Pulse: 71 65 64 71  Resp: 20  18 18   Temp: 99.4 F (37.4 C) 98.8 F (37.1 C) 98.8 F (37.1 C) 99.9 F (37.7 C)  TempSrc:  Oral Oral Oral  SpO2: 94% 95% 95% 95%  Weight:      Height:        General: Elderly male not in distress HEENT: Moist mucosa, supple neck Chest: Clear to auscultation bilaterally CVS: Normal S1 and S2, no murmurs rubs  or gallop GI: Soft, nondistended, nontender, bowel sounds present Musculoskeletal: Warm, no edema CNS: Alert and oriented    The results of significant diagnostics from this hospitalization (including imaging, microbiology, ancillary and laboratory) are listed below for reference.     Microbiology: Recent Results (from the past 240 hour(s))  Culture, blood (Routine x 2)     Status: None (Preliminary result)   Collection Time: 12/20/17 11:50 AM  Result Value Ref Range Status   Specimen Description BLOOD LEFT ARM DRAWN BY RN  Final   Special Requests   Final    BOTTLES DRAWN AEROBIC AND ANAEROBIC Blood Culture adequate volume   Culture   Final    NO GROWTH 4 DAYS Performed at [facility], [address] [place], [place] [address]    Report Status PENDING  Incomplete  Culture, blood (Routine x 2)     Status: None (Preliminary result)   Collection Time: 12/20/17 11:54 AM  Result Value Ref Range Status   Specimen Description BLOOD RIGHT ARM DRAWN BY RN  Final   Special Requests   Final    BOTTLES DRAWN AEROBIC AND ANAEROBIC Blood Culture adequate volume   Culture   Final    NO GROWTH 4 DAYS Performed at [facility], [address], [place], [place] [address]    Report Status PENDING  Incomplete  Culture, Urine     Status: None   Collection Time: 12/20/17  2:29 PM  Result Value Ref Range Status   Specimen Description   Final    URINE, CLEAN CATCH Performed at [facility], [address] [place], [place] [address]    Special Requests   Final    NONE Performed at [facility], [address] [place], [place] [address]    Culture   Final    NO GROWTH Performed at [facility], [place] [address], [place], [place] [address]    Report Status 12/22/2017 FINAL  Final  MRSA PCR Screening     Status: None   Collection Time: 12/20/17  3:59 PM  Result Value Ref Range Status   MRSA by PCR NEGATIVE NEGATIVE Final    Comment:        The GeneXpert MRSA Assay (FDA approved for  NASAL specimens only), is one component of a comprehensive MRSA colonization surveillance program. It is not intended to diagnose MRSA infection nor to guide or monitor treatment for MRSA infections. Performed at [facility], [address] [place], [place] [address]   Culture, blood (routine x 2)     Status: None (Preliminary result)   Collection Time: 12/22/17  8:15 AM  Result Value Ref Range Status   Specimen Description BLOOD LEFT ARM  Final   Special Requests  Final    BOTTLES DRAWN AEROBIC AND ANAEROBIC Blood Culture adequate volume   Culture   Final    NO GROWTH 2 DAYS Performed at [facility], [address] [place], [place] [address]    Report Status PENDING  Incomplete  Culture, blood (routine x 2)     Status: None (Preliminary result)   Collection Time: 12/22/17  8:23 AM  Result Value Ref Range Status   Specimen Description BLOOD RIGHT HAND  Final   Special Requests BOTTLES DRAWN AEROBIC ONLY  Final   Culture   Final    NO GROWTH 2 DAYS Performed at [facility], [address], [place], [place] [address]    Report Status PENDING  Incomplete     Labs: BNP (last 3 results) No results for input(s): BNP in the last 8760 hours. Basic Metabolic Panel: Recent Labs  Lab 12/20/17 1153 12/20/17 1818 12/21/17 0458 12/22/17 0428 12/23/17 0428 12/24/17 0614  NA 139  --  139 136 136 138  K 4.2  --  3.5 3.6 3.1* 3.1*  CL 101  --  106 103 101 103  CO2 28  --  24 26 27 28   GLUCOSE 122*  --  97 110* 107* 92  BUN 27*  --  21* 14 15 12   CREATININE 1.79*  --  1.11 1.01 1.06 0.98  CALCIUM 9.4  --  8.3* 8.2* 8.2* 8.2*  MG  --  1.5*  --  1.8  --   --   PHOS  --  3.3  --   --   --   --    Liver Function Tests: Recent Labs  Lab 12/20/17 1153 12/22/17 0428  AST 25 19  ALT 24 16*  ALKPHOS 53 47  BILITOT 1.7* 0.9  PROT 7.5 6.5  ALBUMIN 4.3 3.3*   No results for input(s): LIPASE, AMYLASE in the last 168 hours. No results for input(s): AMMONIA in the last 168  hours. CBC: Recent Labs  Lab 12/20/17 1153 12/21/17 0458 12/22/17 0428 12/23/17 0428 12/24/17 0614  WBC 37.0* 27.0* 19.0* 7.4 7.1  NEUTROABS 31.8*  --   --   --   --   HGB 14.3 13.2 12.7* 12.8* 13.0  HCT 43.6 41.8 39.6 39.6 40.2  MCV 96.9 98.6 97.3 96.1 95.3  PLT 197 162 163 154 184   Cardiac Enzymes: Recent Labs  Lab 12/22/17 1553 12/22/17 2236 12/23/17 0428  TROPONINI <0.03 0.04* 0.03*   BNP: Invalid input(s): POCBNP CBG: No results for input(s): GLUCAP in the last 168 hours. D-Dimer No results for input(s): DDIMER in the last 72 hours. Hgb A1c No results for input(s): HGBA1C in the last 72 hours. Lipid Profile No results for input(s): CHOL, HDL, LDLCALC, TRIG, CHOLHDL, LDLDIRECT in the last 72 hours. Thyroid function studies Recent Labs    12/22/17 2236  TSH 1.532   Anemia work up No results for input(s): VITAMINB12, FOLATE, FERRITIN, TIBC, IRON, RETICCTPCT in the last 72 hours. Urinalysis    Component Value Date/Time   COLORURINE YELLOW 12/20/2017 1429   APPEARANCEUR HAZY (A) 12/20/2017 1429   LABSPEC 1.015 12/20/2017 1429   PHURINE 7.0 12/20/2017 1429   GLUCOSEU NEGATIVE 12/20/2017 1429   HGBUR NEGATIVE 12/20/2017 1429   HGBUR negative 01/01/2008 0000   BILIRUBINUR NEGATIVE 12/20/2017 1429   KETONESUR NEGATIVE 12/20/2017 1429   PROTEINUR NEGATIVE 12/20/2017 1429   UROBILINOGEN 0.2 10/14/2013 0835   NITRITE NEGATIVE 12/20/2017 1429   LEUKOCYTESUR MODERATE (A) 12/20/2017 1429  Sepsis Labs Invalid input(s): PROCALCITONIN,  WBC,  LACTICIDVEN Microbiology Recent Results (from the past 240 hour(s))  Culture, blood (Routine x 2)     Status: None (Preliminary result)   Collection Time: 12/20/17 11:50 AM  Result Value Ref Range Status   Specimen Description BLOOD LEFT ARM DRAWN BY RN  Final   Special Requests   Final    BOTTLES DRAWN AEROBIC AND ANAEROBIC Blood Culture adequate volume   Culture   Final    NO GROWTH 4 DAYS Performed at [facility], [address], [place], [place] [address]    Report Status PENDING  Incomplete  Culture, blood (Routine x 2)     Status: None (Preliminary result)   Collection Time: 12/20/17 11:54 AM  Result Value Ref Range Status   Specimen Description BLOOD RIGHT ARM DRAWN BY RN  Final   Special Requests   Final    BOTTLES DRAWN AEROBIC AND ANAEROBIC Blood Culture adequate volume   Culture   Final    NO GROWTH 4 DAYS Performed at [facility] [address] [place], [place] [address]    Report Status PENDING  Incomplete  Culture, Urine     Status: None   Collection Time: 12/20/17  2:29 PM  Result Value Ref Range Status   Specimen Description   Final    URINE, CLEAN CATCH Performed at [facility], [address] [place], [place] [address]    Special Requests   Final    NONE Performed at [facility], [address] [place], [place] [address]    Culture   Final    NO GROWTH Performed at [facility], [place] [address], [place], [place] [address]    Report Status 12/22/2017 FINAL  Final  MRSA PCR Screening     Status: None   Collection Time: 12/20/17  3:59 PM  Result Value Ref Range Status   MRSA by PCR NEGATIVE NEGATIVE Final    Comment:        The GeneXpert MRSA Assay (FDA approved for NASAL specimens only), is one component of a comprehensive MRSA colonization surveillance program. It is not intended to diagnose MRSA infection nor to guide or monitor treatment for MRSA infections. Performed at [facility], [address], [place], [place] [address]   Culture, blood (routine x 2)     Status: None (Preliminary result)   Collection Time: 12/22/17  8:15 AM  Result Value Ref Range Status   Specimen Description BLOOD LEFT ARM  Final   Special Requests   Final    BOTTLES DRAWN AEROBIC AND ANAEROBIC Blood Culture adequate volume   Culture   Final    NO GROWTH 2 DAYS Performed at [facility], [address] [place], [place] [address]    Report Status PENDING  Incomplete   Culture, blood (routine x 2)     Status: None (Preliminary result)   Collection Time: 12/22/17  8:23 AM  Result Value Ref Range Status   Specimen Description BLOOD RIGHT HAND  Final   Special Requests BOTTLES DRAWN AEROBIC ONLY  Final   Culture   Final    NO GROWTH 2 DAYS Performed at [facility], [address], [place], [place] [address]    Report Status PENDING  Incomplete     Time coordinating discharge: 35 minutes  SIGNED:   Louellen Molder, MD  [facility] 12/24/2017, 10:27 AM Pager   If 7PM-7AM, please contact night-coverage [email or web address] Password [email or web address]

## 2017-12-24 NOTE — Progress Notes (Signed)
Pt's IV catheter removed and intact. Pt's IV site clean dry and intact. Discharge instructions including medications and follow up appointments were reviewed and discussed with patient's wife. Pt's wife also informed of Home health PT. All questions were answered and no further questions at this time. Pt in stable condition and in no acute distress at this time. Pt escorted by nurse tech.

## 2017-12-25 LAB — CULTURE, BLOOD (ROUTINE X 2)
Culture: NO GROWTH
Culture: NO GROWTH
SPECIAL REQUESTS: ADEQUATE
SPECIAL REQUESTS: ADEQUATE

## 2017-12-27 LAB — CULTURE, BLOOD (ROUTINE X 2)
Culture: NO GROWTH
Culture: NO GROWTH
Special Requests: ADEQUATE

## 2018-01-02 DIAGNOSIS — G4733 Obstructive sleep apnea (adult) (pediatric): Secondary | ICD-10-CM | POA: Diagnosis not present

## 2018-01-02 DIAGNOSIS — Z6841 Body Mass Index (BMI) 40.0 and over, adult: Secondary | ICD-10-CM | POA: Diagnosis not present

## 2018-01-02 DIAGNOSIS — G4710 Hypersomnia, unspecified: Secondary | ICD-10-CM | POA: Diagnosis not present

## 2018-01-02 DIAGNOSIS — Z299 Encounter for prophylactic measures, unspecified: Secondary | ICD-10-CM | POA: Diagnosis not present

## 2018-01-02 DIAGNOSIS — Z713 Dietary counseling and surveillance: Secondary | ICD-10-CM | POA: Diagnosis not present

## 2018-01-02 DIAGNOSIS — R51 Headache: Secondary | ICD-10-CM | POA: Diagnosis not present

## 2018-05-16 DIAGNOSIS — Z299 Encounter for prophylactic measures, unspecified: Secondary | ICD-10-CM | POA: Diagnosis not present

## 2018-05-16 DIAGNOSIS — Z6841 Body Mass Index (BMI) 40.0 and over, adult: Secondary | ICD-10-CM | POA: Diagnosis not present

## 2018-05-16 DIAGNOSIS — G309 Alzheimer's disease, unspecified: Secondary | ICD-10-CM | POA: Diagnosis not present

## 2018-05-16 DIAGNOSIS — Z23 Encounter for immunization: Secondary | ICD-10-CM | POA: Diagnosis not present

## 2018-05-16 DIAGNOSIS — F0280 Dementia in other diseases classified elsewhere without behavioral disturbance: Secondary | ICD-10-CM | POA: Diagnosis not present

## 2018-05-16 DIAGNOSIS — M25569 Pain in unspecified knee: Secondary | ICD-10-CM | POA: Diagnosis not present

## 2018-05-25 DIAGNOSIS — N451 Epididymitis: Secondary | ICD-10-CM | POA: Diagnosis not present

## 2018-05-25 DIAGNOSIS — Z6841 Body Mass Index (BMI) 40.0 and over, adult: Secondary | ICD-10-CM | POA: Diagnosis not present

## 2018-05-25 DIAGNOSIS — G309 Alzheimer's disease, unspecified: Secondary | ICD-10-CM | POA: Diagnosis not present

## 2018-05-25 DIAGNOSIS — G4710 Hypersomnia, unspecified: Secondary | ICD-10-CM | POA: Diagnosis not present

## 2018-05-25 DIAGNOSIS — Z299 Encounter for prophylactic measures, unspecified: Secondary | ICD-10-CM | POA: Diagnosis not present

## 2018-05-25 DIAGNOSIS — F0280 Dementia in other diseases classified elsewhere without behavioral disturbance: Secondary | ICD-10-CM | POA: Diagnosis not present

## 2018-06-20 DIAGNOSIS — Z125 Encounter for screening for malignant neoplasm of prostate: Secondary | ICD-10-CM | POA: Diagnosis not present

## 2018-06-20 DIAGNOSIS — E7800 Pure hypercholesterolemia, unspecified: Secondary | ICD-10-CM | POA: Diagnosis not present

## 2018-06-20 DIAGNOSIS — R5383 Other fatigue: Secondary | ICD-10-CM | POA: Diagnosis not present

## 2018-06-20 DIAGNOSIS — Z1211 Encounter for screening for malignant neoplasm of colon: Secondary | ICD-10-CM | POA: Diagnosis not present

## 2018-06-20 DIAGNOSIS — Z79899 Other long term (current) drug therapy: Secondary | ICD-10-CM | POA: Diagnosis not present

## 2018-06-20 DIAGNOSIS — Z0000 Encounter for general adult medical examination without abnormal findings: Secondary | ICD-10-CM | POA: Diagnosis not present

## 2018-06-20 DIAGNOSIS — Z1331 Encounter for screening for depression: Secondary | ICD-10-CM | POA: Diagnosis not present

## 2018-06-20 DIAGNOSIS — G309 Alzheimer's disease, unspecified: Secondary | ICD-10-CM | POA: Diagnosis not present

## 2018-06-20 DIAGNOSIS — R319 Hematuria, unspecified: Secondary | ICD-10-CM | POA: Diagnosis not present

## 2018-06-20 DIAGNOSIS — Z1339 Encounter for screening examination for other mental health and behavioral disorders: Secondary | ICD-10-CM | POA: Diagnosis not present

## 2018-06-20 DIAGNOSIS — Z7189 Other specified counseling: Secondary | ICD-10-CM | POA: Diagnosis not present

## 2018-07-04 DIAGNOSIS — Z6841 Body Mass Index (BMI) 40.0 and over, adult: Secondary | ICD-10-CM | POA: Diagnosis not present

## 2018-07-04 DIAGNOSIS — F0280 Dementia in other diseases classified elsewhere without behavioral disturbance: Secondary | ICD-10-CM | POA: Diagnosis not present

## 2018-07-04 DIAGNOSIS — R319 Hematuria, unspecified: Secondary | ICD-10-CM | POA: Diagnosis not present

## 2018-07-04 DIAGNOSIS — Z299 Encounter for prophylactic measures, unspecified: Secondary | ICD-10-CM | POA: Diagnosis not present

## 2018-07-04 DIAGNOSIS — G309 Alzheimer's disease, unspecified: Secondary | ICD-10-CM | POA: Diagnosis not present

## 2019-12-11 DIAGNOSIS — G4733 Obstructive sleep apnea (adult) (pediatric): Secondary | ICD-10-CM | POA: Diagnosis not present

## 2019-12-11 DIAGNOSIS — Z299 Encounter for prophylactic measures, unspecified: Secondary | ICD-10-CM | POA: Diagnosis not present

## 2019-12-11 DIAGNOSIS — R519 Headache, unspecified: Secondary | ICD-10-CM | POA: Diagnosis not present

## 2019-12-11 DIAGNOSIS — Z87891 Personal history of nicotine dependence: Secondary | ICD-10-CM | POA: Diagnosis not present

## 2019-12-11 DIAGNOSIS — G309 Alzheimer's disease, unspecified: Secondary | ICD-10-CM | POA: Diagnosis not present

## 2020-03-11 DIAGNOSIS — Z0000 Encounter for general adult medical examination without abnormal findings: Secondary | ICD-10-CM | POA: Diagnosis not present

## 2020-03-11 DIAGNOSIS — Z299 Encounter for prophylactic measures, unspecified: Secondary | ICD-10-CM | POA: Diagnosis not present

## 2020-03-11 DIAGNOSIS — Z7189 Other specified counseling: Secondary | ICD-10-CM | POA: Diagnosis not present

## 2020-03-12 DIAGNOSIS — Z79899 Other long term (current) drug therapy: Secondary | ICD-10-CM | POA: Diagnosis not present

## 2020-03-12 DIAGNOSIS — E7800 Pure hypercholesterolemia, unspecified: Secondary | ICD-10-CM | POA: Diagnosis not present

## 2020-03-12 DIAGNOSIS — R5383 Other fatigue: Secondary | ICD-10-CM | POA: Diagnosis not present

## 2020-03-25 DIAGNOSIS — L989 Disorder of the skin and subcutaneous tissue, unspecified: Secondary | ICD-10-CM | POA: Diagnosis not present

## 2020-03-25 DIAGNOSIS — C44311 Basal cell carcinoma of skin of nose: Secondary | ICD-10-CM | POA: Diagnosis not present

## 2020-03-25 DIAGNOSIS — D485 Neoplasm of uncertain behavior of skin: Secondary | ICD-10-CM | POA: Diagnosis not present

## 2020-03-25 DIAGNOSIS — Z299 Encounter for prophylactic measures, unspecified: Secondary | ICD-10-CM | POA: Diagnosis not present

## 2020-03-25 DIAGNOSIS — G309 Alzheimer's disease, unspecified: Secondary | ICD-10-CM | POA: Diagnosis not present

## 2020-03-25 DIAGNOSIS — G4733 Obstructive sleep apnea (adult) (pediatric): Secondary | ICD-10-CM | POA: Diagnosis not present

## 2020-03-25 DIAGNOSIS — Z789 Other specified health status: Secondary | ICD-10-CM | POA: Diagnosis not present

## 2020-04-10 DIAGNOSIS — Z9189 Other specified personal risk factors, not elsewhere classified: Secondary | ICD-10-CM | POA: Diagnosis not present

## 2020-05-14 DIAGNOSIS — Z299 Encounter for prophylactic measures, unspecified: Secondary | ICD-10-CM | POA: Diagnosis not present

## 2020-05-14 DIAGNOSIS — R350 Frequency of micturition: Secondary | ICD-10-CM | POA: Diagnosis not present

## 2020-05-14 DIAGNOSIS — G309 Alzheimer's disease, unspecified: Secondary | ICD-10-CM | POA: Diagnosis not present

## 2020-05-29 DIAGNOSIS — Z23 Encounter for immunization: Secondary | ICD-10-CM | POA: Diagnosis not present

## 2021-10-06 DIAGNOSIS — R079 Chest pain, unspecified: Secondary | ICD-10-CM | POA: Diagnosis not present

## 2021-10-06 DIAGNOSIS — L989 Disorder of the skin and subcutaneous tissue, unspecified: Secondary | ICD-10-CM | POA: Diagnosis not present

## 2021-10-06 DIAGNOSIS — Z6841 Body Mass Index (BMI) 40.0 and over, adult: Secondary | ICD-10-CM | POA: Diagnosis not present

## 2021-10-06 DIAGNOSIS — F0280 Dementia in other diseases classified elsewhere without behavioral disturbance: Secondary | ICD-10-CM | POA: Diagnosis not present

## 2021-10-06 DIAGNOSIS — G309 Alzheimer's disease, unspecified: Secondary | ICD-10-CM | POA: Diagnosis not present

## 2021-10-06 DIAGNOSIS — Z789 Other specified health status: Secondary | ICD-10-CM | POA: Diagnosis not present

## 2021-10-06 DIAGNOSIS — Z299 Encounter for prophylactic measures, unspecified: Secondary | ICD-10-CM | POA: Diagnosis not present

## 2021-10-29 DIAGNOSIS — Z9114 Patient's other noncompliance with medication regimen: Secondary | ICD-10-CM | POA: Diagnosis not present

## 2021-10-29 DIAGNOSIS — R0789 Other chest pain: Secondary | ICD-10-CM | POA: Diagnosis not present

## 2021-10-29 DIAGNOSIS — G4733 Obstructive sleep apnea (adult) (pediatric): Secondary | ICD-10-CM | POA: Diagnosis not present

## 2022-01-29 DIAGNOSIS — R0789 Other chest pain: Secondary | ICD-10-CM | POA: Diagnosis not present

## 2022-01-29 DIAGNOSIS — G4733 Obstructive sleep apnea (adult) (pediatric): Secondary | ICD-10-CM | POA: Diagnosis not present

## 2022-02-16 DIAGNOSIS — Z299 Encounter for prophylactic measures, unspecified: Secondary | ICD-10-CM | POA: Diagnosis not present

## 2022-02-16 DIAGNOSIS — R509 Fever, unspecified: Secondary | ICD-10-CM | POA: Diagnosis not present

## 2022-02-16 DIAGNOSIS — Z20822 Contact with and (suspected) exposure to covid-19: Secondary | ICD-10-CM | POA: Diagnosis not present

## 2022-02-16 DIAGNOSIS — Z789 Other specified health status: Secondary | ICD-10-CM | POA: Diagnosis not present

## 2022-02-16 DIAGNOSIS — F0280 Dementia in other diseases classified elsewhere without behavioral disturbance: Secondary | ICD-10-CM | POA: Diagnosis not present

## 2022-02-16 DIAGNOSIS — G4710 Hypersomnia, unspecified: Secondary | ICD-10-CM | POA: Diagnosis not present

## 2022-02-16 DIAGNOSIS — Z6841 Body Mass Index (BMI) 40.0 and over, adult: Secondary | ICD-10-CM | POA: Diagnosis not present

## 2022-03-12 DIAGNOSIS — G4733 Obstructive sleep apnea (adult) (pediatric): Secondary | ICD-10-CM | POA: Diagnosis not present

## 2022-03-12 DIAGNOSIS — Z299 Encounter for prophylactic measures, unspecified: Secondary | ICD-10-CM | POA: Diagnosis not present

## 2022-03-12 DIAGNOSIS — Z6841 Body Mass Index (BMI) 40.0 and over, adult: Secondary | ICD-10-CM | POA: Diagnosis not present

## 2022-03-12 DIAGNOSIS — F0280 Dementia in other diseases classified elsewhere without behavioral disturbance: Secondary | ICD-10-CM | POA: Diagnosis not present

## 2022-03-12 DIAGNOSIS — G309 Alzheimer's disease, unspecified: Secondary | ICD-10-CM | POA: Diagnosis not present

## 2022-03-12 DIAGNOSIS — C4491 Basal cell carcinoma of skin, unspecified: Secondary | ICD-10-CM | POA: Diagnosis not present

## 2022-04-04 ENCOUNTER — Emergency Department (HOSPITAL_COMMUNITY): Payer: Medicare PPO

## 2022-04-04 ENCOUNTER — Other Ambulatory Visit: Payer: Self-pay

## 2022-04-04 ENCOUNTER — Encounter (HOSPITAL_COMMUNITY): Payer: Self-pay | Admitting: Emergency Medicine

## 2022-04-04 ENCOUNTER — Observation Stay (HOSPITAL_COMMUNITY)
Admission: EM | Admit: 2022-04-04 | Discharge: 2022-04-05 | Disposition: A | Discharge status: Home Health | Payer: Medicare PPO | Attending: Internal Medicine | Admitting: Internal Medicine

## 2022-04-04 DIAGNOSIS — Z9181 History of falling: Secondary | ICD-10-CM | POA: Diagnosis not present

## 2022-04-04 DIAGNOSIS — F0280 Dementia in other diseases classified elsewhere without behavioral disturbance: Secondary | ICD-10-CM | POA: Diagnosis not present

## 2022-04-04 DIAGNOSIS — Z20822 Contact with and (suspected) exposure to covid-19: Secondary | ICD-10-CM | POA: Insufficient documentation

## 2022-04-04 DIAGNOSIS — S2241XA Multiple fractures of ribs, right side, initial encounter for closed fracture: Principal | ICD-10-CM | POA: Insufficient documentation

## 2022-04-04 DIAGNOSIS — F0390 Unspecified dementia without behavioral disturbance: Secondary | ICD-10-CM | POA: Diagnosis not present

## 2022-04-04 DIAGNOSIS — Z87891 Personal history of nicotine dependence: Secondary | ICD-10-CM | POA: Insufficient documentation

## 2022-04-04 DIAGNOSIS — M6281 Muscle weakness (generalized): Secondary | ICD-10-CM | POA: Diagnosis not present

## 2022-04-04 DIAGNOSIS — M47812 Spondylosis without myelopathy or radiculopathy, cervical region: Secondary | ICD-10-CM | POA: Diagnosis not present

## 2022-04-04 DIAGNOSIS — Z96659 Presence of unspecified artificial knee joint: Secondary | ICD-10-CM | POA: Insufficient documentation

## 2022-04-04 DIAGNOSIS — S3991XA Unspecified injury of abdomen, initial encounter: Secondary | ICD-10-CM | POA: Diagnosis not present

## 2022-04-04 DIAGNOSIS — R0781 Pleurodynia: Secondary | ICD-10-CM | POA: Diagnosis not present

## 2022-04-04 DIAGNOSIS — R2681 Unsteadiness on feet: Secondary | ICD-10-CM | POA: Diagnosis not present

## 2022-04-04 DIAGNOSIS — R269 Unspecified abnormalities of gait and mobility: Secondary | ICD-10-CM | POA: Insufficient documentation

## 2022-04-04 DIAGNOSIS — S2249XA Multiple fractures of ribs, unspecified side, initial encounter for closed fracture: Secondary | ICD-10-CM | POA: Diagnosis present

## 2022-04-04 DIAGNOSIS — E66813 Obesity, class 3: Secondary | ICD-10-CM | POA: Diagnosis present

## 2022-04-04 DIAGNOSIS — I700 Atherosclerosis of aorta: Secondary | ICD-10-CM | POA: Insufficient documentation

## 2022-04-04 DIAGNOSIS — G309 Alzheimer's disease, unspecified: Secondary | ICD-10-CM | POA: Diagnosis not present

## 2022-04-04 DIAGNOSIS — N400 Enlarged prostate without lower urinary tract symptoms: Secondary | ICD-10-CM

## 2022-04-04 DIAGNOSIS — N281 Cyst of kidney, acquired: Secondary | ICD-10-CM | POA: Diagnosis not present

## 2022-04-04 DIAGNOSIS — J479 Bronchiectasis, uncomplicated: Secondary | ICD-10-CM | POA: Diagnosis not present

## 2022-04-04 DIAGNOSIS — J811 Chronic pulmonary edema: Secondary | ICD-10-CM | POA: Diagnosis not present

## 2022-04-04 DIAGNOSIS — K862 Cyst of pancreas: Secondary | ICD-10-CM | POA: Insufficient documentation

## 2022-04-04 DIAGNOSIS — S0990XA Unspecified injury of head, initial encounter: Secondary | ICD-10-CM | POA: Diagnosis not present

## 2022-04-04 DIAGNOSIS — I2510 Atherosclerotic heart disease of native coronary artery without angina pectoris: Secondary | ICD-10-CM | POA: Insufficient documentation

## 2022-04-04 DIAGNOSIS — Z79899 Other long term (current) drug therapy: Secondary | ICD-10-CM | POA: Diagnosis not present

## 2022-04-04 DIAGNOSIS — S199XXA Unspecified injury of neck, initial encounter: Secondary | ICD-10-CM | POA: Diagnosis not present

## 2022-04-04 DIAGNOSIS — R0902 Hypoxemia: Secondary | ICD-10-CM | POA: Diagnosis not present

## 2022-04-04 DIAGNOSIS — W19XXXA Unspecified fall, initial encounter: Secondary | ICD-10-CM

## 2022-04-04 DIAGNOSIS — R0789 Other chest pain: Secondary | ICD-10-CM | POA: Diagnosis not present

## 2022-04-04 DIAGNOSIS — Y92009 Unspecified place in unspecified non-institutional (private) residence as the place of occurrence of the external cause: Secondary | ICD-10-CM | POA: Diagnosis not present

## 2022-04-04 DIAGNOSIS — G250 Essential tremor: Secondary | ICD-10-CM | POA: Diagnosis not present

## 2022-04-04 DIAGNOSIS — Z981 Arthrodesis status: Secondary | ICD-10-CM | POA: Diagnosis not present

## 2022-04-04 DIAGNOSIS — W06XXXA Fall from bed, initial encounter: Secondary | ICD-10-CM | POA: Diagnosis not present

## 2022-04-04 DIAGNOSIS — R911 Solitary pulmonary nodule: Secondary | ICD-10-CM | POA: Diagnosis not present

## 2022-04-04 DIAGNOSIS — K4090 Unilateral inguinal hernia, without obstruction or gangrene, not specified as recurrent: Secondary | ICD-10-CM | POA: Diagnosis not present

## 2022-04-04 DIAGNOSIS — J432 Centrilobular emphysema: Secondary | ICD-10-CM | POA: Diagnosis not present

## 2022-04-04 LAB — CBC
HCT: 47.1 % (ref 39.0–52.0)
Hemoglobin: 15.4 g/dL (ref 13.0–17.0)
MCH: 31.4 pg (ref 26.0–34.0)
MCHC: 32.7 g/dL (ref 30.0–36.0)
MCV: 96.1 fL (ref 80.0–100.0)
Platelets: 232 10*3/uL (ref 150–400)
RBC: 4.9 MIL/uL (ref 4.22–5.81)
RDW: 13.6 % (ref 11.5–15.5)
WBC: 17 10*3/uL — ABNORMAL HIGH (ref 4.0–10.5)
nRBC: 0 % (ref 0.0–0.2)

## 2022-04-04 LAB — URINALYSIS, COMPLETE (UACMP) WITH MICROSCOPIC
Bacteria, UA: NONE SEEN
Bilirubin Urine: NEGATIVE
Glucose, UA: NEGATIVE mg/dL
Ketones, ur: NEGATIVE mg/dL
Leukocytes,Ua: NEGATIVE
Nitrite: NEGATIVE
Protein, ur: NEGATIVE mg/dL
Specific Gravity, Urine: 1.023 (ref 1.005–1.030)
pH: 5 (ref 5.0–8.0)

## 2022-04-04 LAB — COMPREHENSIVE METABOLIC PANEL
ALT: 24 U/L (ref 0–44)
AST: 29 U/L (ref 15–41)
Albumin: 4 g/dL (ref 3.5–5.0)
Alkaline Phosphatase: 65 U/L (ref 38–126)
Anion gap: 8 (ref 5–15)
BUN: 18 mg/dL (ref 8–23)
CO2: 27 mmol/L (ref 22–32)
Calcium: 8.9 mg/dL (ref 8.9–10.3)
Chloride: 102 mmol/L (ref 98–111)
Creatinine, Ser: 0.93 mg/dL (ref 0.61–1.24)
GFR, Estimated: >60 mL/min (ref >60)
Glucose, Bld: 126 mg/dL — ABNORMAL HIGH (ref 70–99)
Potassium: 4.1 mmol/L (ref 3.5–5.1)
Sodium: 137 mmol/L (ref 135–145)
Total Bilirubin: 0.9 mg/dL (ref 0.3–1.2)
Total Protein: 7.4 g/dL (ref 6.5–8.1)

## 2022-04-04 LAB — RESP PANEL BY RT-PCR (FLU A&B, COVID) ARPGX2
Influenza A by PCR: NEGATIVE
Influenza B by PCR: NEGATIVE
SARS Coronavirus 2 by RT PCR: NEGATIVE

## 2022-04-04 MED ORDER — ACETAMINOPHEN 650 MG RE SUPP: 650.0000 mg | Freq: Four times a day (QID) | RECTAL | Status: DC | PRN

## 2022-04-04 MED ORDER — ENOXAPARIN SODIUM 40 MG/0.4ML IJ SOSY: 40.0000 mg | PREFILLED_SYRINGE | INTRAMUSCULAR | Status: DC

## 2022-04-04 MED ORDER — ACETAMINOPHEN 325 MG PO TABS
650.0000 mg | ORAL_TABLET | Freq: Four times a day (QID) | ORAL | Status: DC | PRN
  Administered 2022-04-04: 650 mg via ORAL
  Filled 2022-04-04: qty 2

## 2022-04-04 MED ORDER — ONDANSETRON HCL 4 MG/2ML IJ SOLN: 4.0000 mg | Freq: Four times a day (QID) | INTRAMUSCULAR | Status: DC | PRN

## 2022-04-04 MED ORDER — TAMSULOSIN HCL 0.4 MG PO CAPS
0.4000 mg | ORAL_CAPSULE | Freq: Every evening | ORAL | Status: DC
  Administered 2022-04-04: 0.4 mg via ORAL
  Filled 2022-04-04: qty 1

## 2022-04-04 MED ORDER — HYDROCODONE-ACETAMINOPHEN 5-325 MG PO TABS
1.0000 | ORAL_TABLET | Freq: Once | ORAL | Status: AC
  Administered 2022-04-04: 1 via ORAL
  Filled 2022-04-04: qty 1

## 2022-04-04 MED ORDER — DONEPEZIL HCL 5 MG PO TABS
10.0000 mg | ORAL_TABLET | Freq: Every day | ORAL | Status: DC
  Administered 2022-04-04: 10 mg via ORAL
  Filled 2022-04-04: qty 2

## 2022-04-04 MED ORDER — PRIMIDONE 50 MG PO TABS
100.0000 mg | ORAL_TABLET | Freq: Two times a day (BID) | ORAL | Status: DC
  Administered 2022-04-04 – 2022-04-05 (×3): 100 mg via ORAL
  Filled 2022-04-04 (×3): qty 2

## 2022-04-04 MED ORDER — LACTATED RINGERS IV SOLN: INTRAVENOUS | Status: AC

## 2022-04-04 MED ORDER — HYDROCODONE-ACETAMINOPHEN 5-325 MG PO TABS
1.0000 | ORAL_TABLET | ORAL | Status: DC | PRN
  Administered 2022-04-05: 2 via ORAL
  Administered 2022-04-05: 1 via ORAL
  Filled 2022-04-04: qty 2
  Filled 2022-04-04: qty 1

## 2022-04-04 MED ORDER — ENOXAPARIN SODIUM 80 MG/0.8ML IJ SOSY
65.0000 mg | PREFILLED_SYRINGE | INTRAMUSCULAR | Status: DC
  Administered 2022-04-04 – 2022-04-05 (×2): 65 mg via SUBCUTANEOUS
  Filled 2022-04-04 (×2): qty 0.8

## 2022-04-04 MED ORDER — HYDROMORPHONE HCL 1 MG/ML IJ SOLN
1.0000 mg | Freq: Once | INTRAMUSCULAR | Status: AC
  Administered 2022-04-04: 1 mg via INTRAMUSCULAR
  Filled 2022-04-04: qty 1

## 2022-04-04 MED ORDER — GABAPENTIN 300 MG PO CAPS
300.0000 mg | ORAL_CAPSULE | Freq: Two times a day (BID) | ORAL | Status: DC
  Administered 2022-04-04 – 2022-04-05 (×3): 300 mg via ORAL
  Filled 2022-04-04 (×3): qty 1

## 2022-04-04 MED ORDER — ONDANSETRON HCL 4 MG PO TABS: 4.0000 mg | ORAL_TABLET | Freq: Four times a day (QID) | ORAL | Status: DC | PRN

## 2022-04-04 MED ORDER — BUTALBITAL-APAP-CAFFEINE 50-325-40 MG PO TABS: 1.0000 | ORAL_TABLET | Freq: Four times a day (QID) | ORAL | Status: DC | PRN

## 2022-04-04 NOTE — ED Notes (Signed)
Pt in CT, family x2 in room updated/ questions answered.

## 2022-04-04 NOTE — ED Notes (Signed)
ED Provider at bedside. 

## 2022-04-04 NOTE — H&P (Signed)
History and Physical    Lawrence Reynolds GYF:[id number] DOB: 1941/02/16 DOA: 04/04/2022  PCP: Monico Blitz, MD  Patient coming from: home  I have personally briefly reviewed patient's old medical records in [place]  Chief Complaint: Fall  HPI: Lawrence Reynolds is a 81 y.o. male with medical history significant of dementia, BPH, essential tremor, lives at home with his wife.  Patient is normally ambulatory at baseline.  Patient was reportedly in his usual state of health when he suffered a fall on the night of admission.  This was an unwitnessed fall, but family feels that he may have slid out of bed.  He landed on his right side.  He was brought to the hospital for evaluation where imaging showed several uncomplicated right-sided rib fractures.  CT imaging of his head and neck were unremarkable.  He was also noted to be mildly hypoxic on room air at 85%.  He was having significant pain and received pain medications.  Admission requested for observation for hypoxia and pain management.  Review of Systems: As per HPI otherwise 10 point review of systems negative.    Past Medical History:  Diagnosis Date   BPH (benign prostatic hypertrophy)    Diverticulitis    Headache(784.0)     Past Surgical History:  Procedure Laterality Date   CHOLECYSTECTOMY     COLONOSCOPY  2008   Dr. Gala Romney: Tubular adenomas, diverticulosis.   HERNIA REPAIR     twice   JOINT REPLACEMENT     SMALL INTESTINE SURGERY     ???? Per patient for diverticulitis   TOTAL KNEE ARTHROPLASTY      Social History:  reports that he has quit smoking. He has never used smokeless tobacco. He reports that he does not drink alcohol and does not use drugs.  No Known Allergies  Family History  Problem Relation Age of Onset   Cancer Brother        lung   Seizures Brother    Dementia Brother    Colon cancer Paternal Uncle    Prostate cancer Paternal Uncle    Colon cancer Other        nephew     Prior to  Admission medications   Medication Sig Start Date End Date Taking? Authorizing Provider  butalbital-acetaminophen-caffeine (FIORICET, ESGIC) 50-325-40 MG tablet Take 1 tablet by mouth every 6 (six) hours as needed for headache. 12/24/17  Yes Dhungel, Nishant, MD  cholecalciferol (VITAMIN D) 1000 units tablet Take 2,000 Units by mouth daily.   Yes [provider]  donepezil (ARICEPT) 10 MG tablet Take 1 tablet by mouth daily. 11/21/17  Yes [provider]  EPINEPHrine (EPIPEN) 0.3 mg/0.3 mL SOAJ injection Inject 1 mL (1 mg total) into the muscle once. 04/06/13  Yes Gosrani, Nimish C, MD  gabapentin (NEURONTIN) 300 MG capsule Take 300 mg by mouth 2 (two) times daily.  01/13/15  Yes [provider]  primidone (MYSOLINE) 50 MG tablet Take 100 mg by mouth 2 (two) times daily.    Yes [provider]  Tamsulosin HCl (FLOMAX) 0.4 MG CAPS Take 0.4 mg by mouth every evening.    Yes [provider]  vitamin B-12 (CYANOCOBALAMIN) 1000 MCG tablet Take 1 tablet (1,000 mcg total) by mouth daily. 12/24/17  Yes Dhungel, Nishant, MD  VITAMIN E PO Take 1 tablet by mouth daily.   Yes [provider]    Physical Exam: Vitals:   04/04/22 0915 04/04/22 0930 04/04/22 0932 04/04/22 1000  BP:    (!) 131/58  Pulse: 81 79  73  Resp: '17 18  18  '$ Temp:   98.7 F (37.1 C) 98.3 F (36.8 C)  TempSrc:   Oral Oral  SpO2: 95% 95%  100%  Weight:      Height:        Constitutional: NAD, calm, comfortable Eyes: PERRL, lids and conjunctivae normal ENMT: Mucous membranes are moist. Posterior pharynx clear of any exudate or lesions.Normal dentition.  Neck: normal, supple, no masses, no thyromegaly Respiratory: clear to auscultation bilaterally, no wheezing, no crackles. Normal respiratory effort. No accessory muscle use.  Cardiovascular: Regular rate and rhythm, no murmurs / rubs / gallops. No extremity edema. 2+ pedal pulses. No carotid bruits.  Abdomen: no tenderness, no  masses palpated. No hepatosplenomegaly. Bowel sounds positive.  Musculoskeletal: no clubbing / cyanosis. No joint deformity upper and lower extremities. Good ROM, no contractures. Normal muscle tone.  Skin: no rashes, lesions, ulcers. No induration Neurologic: CN 2-12 grossly intact. Sensation intact, DTR normal. Strength 5/5 in all 4.  Psychiatric: Pleasant   Labs on Admission: I have personally reviewed following labs and imaging studies  CBC: Recent Labs  Lab 04/04/22 0825  WBC 17.0*  HGB 15.4  HCT 47.1  MCV 96.1  PLT 124   Basic Metabolic Panel: Recent Labs  Lab 04/04/22 0825  NA 137  K 4.1  CL 102  CO2 27  GLUCOSE 126*  BUN 18  CREATININE 0.93  CALCIUM 8.9   GFR: Estimated Creatinine Clearance: 85.2 mL/min (by C-G formula based on SCr of 0.93 mg/dL). Liver Function Tests: Recent Labs  Lab 04/04/22 0825  AST 29  ALT 24  ALKPHOS 65  BILITOT 0.9  PROT 7.4  ALBUMIN 4.0   No results for input(s): "LIPASE", "AMYLASE" in the last 168 hours. No results for input(s): "AMMONIA" in the last 168 hours. Coagulation Profile: No results for input(s): "INR", "PROTIME" in the last 168 hours. Cardiac Enzymes: No results for input(s): "CKTOTAL", "CKMB", "CKMBINDEX", "TROPONINI" in the last 168 hours. BNP (last 3 results) No results for input(s): "PROBNP" in the last 8760 hours. HbA1C: No results for input(s): "HGBA1C" in the last 72 hours. CBG: No results for input(s): "GLUCAP" in the last 168 hours. Lipid Profile: No results for input(s): "CHOL", "HDL", "LDLCALC", "TRIG", "CHOLHDL", "LDLDIRECT" in the last 72 hours. Thyroid Function Tests: No results for input(s): "TSH", "T4TOTAL", "FREET4", "T3FREE", "THYROIDAB" in the last 72 hours. Anemia Panel: No results for input(s): "VITAMINB12", "FOLATE", "FERRITIN", "TIBC", "IRON", "RETICCTPCT" in the last 72 hours. Urine analysis:    Component Value Date/Time   COLORURINE YELLOW 12/20/2017 1429   APPEARANCEUR HAZY (A)  12/20/2017 1429   LABSPEC 1.015 12/20/2017 1429   PHURINE 7.0 12/20/2017 1429   GLUCOSEU NEGATIVE 12/20/2017 1429   HGBUR NEGATIVE 12/20/2017 1429   HGBUR negative 01/01/2008 0000   BILIRUBINUR NEGATIVE 12/20/2017 1429   KETONESUR NEGATIVE 12/20/2017 1429   PROTEINUR NEGATIVE 12/20/2017 1429   UROBILINOGEN 0.2 10/14/2013 0835   NITRITE NEGATIVE 12/20/2017 1429   LEUKOCYTESUR MODERATE (A) 12/20/2017 1429    Radiological Exams on Admission: CT CHEST ABDOMEN PELVIS WO CONTRAST  Result Date: 04/04/2022 CLINICAL DATA:  [place] [address] out of bed. Hit the right side of ribs on something. History of dementia. EXAM: CT CHEST, ABDOMEN AND PELVIS WITHOUT CONTRAST TECHNIQUE: Multidetector CT imaging of the chest, abdomen and pelvis was performed following the standard protocol without IV contrast. RADIATION DOSE REDUCTION: This exam was performed according to the departmental  dose-optimization program which includes automated exposure control, adjustment of the mA and/or kV according to patient size and/or use of iterative reconstruction technique. COMPARISON:  CT AP 12/22/2017. FINDINGS: CT CHEST FINDINGS Cardiovascular: Heart size is within normal limits. Aortic atherosclerosis. Coronary artery calcifications. No pericardial effusion. Mediastinum/Nodes: No enlarged mediastinal, hilar, or axillary lymph nodes. Thyroid gland, trachea, and esophagus demonstrate no significant findings. Lungs/Pleura: Paraseptal and centrilobular emphysema. There is no pleural effusion. Signs chronic postinflammatory changes identified within bilateral lower lung zones including cylindrical bronchiectasis, interstitial reticulation, parenchymal banding, architectural distortion, and volume loss. No acute airspace consolidation identified. No signs of pneumothorax. 5 mm right upper lobe lung nodule, image 50/3. Musculoskeletal: Multiple right rib fractures are identified including: -Anterolateral right second rib -Anterior and posterior right  third rib -Anterior and posterior right fourth rib -Lateral and posterior right fifth rib. The thoracic vertebral bodies are well preserved without signs of fracture. Sternum is intact. CT ABDOMEN PELVIS FINDINGS Hepatobiliary: No suspicious liver abnormality. Status post cholecystectomy. No bile duct dilatation. Pancreas: There are scattered parenchymal calcifications throughout the pancreas compatible with changes due to chronic pancreatitis. Cystic lesion within the posterior head of pancreas is again noted measuring 2 cm, image 73/2. Incompletely characterized on today's unenhanced study. Spleen: Normal in size without focal abnormality. Adrenals/Urinary Tract: Normal adrenal glands. No nephrolithiasis or hydronephrosis. Cyst within the medial cortex of the right kidney measures 2.6 cm, image 70/2. No follow-up recommended. No nephrolithiasis or hydronephrosis. Urinary bladder is unremarkable. Stomach/Bowel: Stomach appears normal. No bowel wall thickening, inflammation, or distension. Vascular/Lymphatic: Aortic atherosclerosis. No aneurysm. No signs of abdominopelvic adenopathy. Reproductive: Normal prostate gland. Other: Small fat containing left inguinal hernia. No free fluid or fluid collections. No pneumoperitoneum. Musculoskeletal: The lumbar vertebral body heights are well preserved. No acute or suspicious osseous findings. IMPRESSION: 1. Multiple right-sided rib fractures are identified. No signs of pneumothorax. 2. Chronic postinflammatory changes within bilateral lower lung zones. 3. Coronary artery calcifications. 4. 2 cm stable cystic lesion within the posterior head of pancreas is again noted with changes consistent with chronic pancreatitis. 5. 5 mm right solid pulmonary nodule within the upper lobe. Per Fleischner Society Guidelines, a non-contrast Chest CT at 12 months is optional. If performed and the nodule is stable at 12 months, no further follow-up is recommended. These guidelines do not  apply to immunocompromised patients and patients with cancer. Follow up in patients with significant comorbidities as clinically warranted. For lung cancer screening, adhere to Lung-RADS guidelines. Reference: Radiology. 2017; 284(1):228-43. 6. Aortic Atherosclerosis (ICD10-I70.0) and Emphysema (ICD10-J43.9). Electronically Signed   By: Kerby Moors M.D.   On: 04/04/2022 07:49   CT HEAD WO CONTRAST (5MM)  Result Date: 04/04/2022 CLINICAL DATA:  Patient fell out of bed and hit right-side. EXAM: CT HEAD WITHOUT CONTRAST CT CERVICAL SPINE WITHOUT CONTRAST TECHNIQUE: Multidetector CT imaging of the head and cervical spine was performed following the standard protocol without intravenous contrast. Multiplanar CT image reconstructions of the cervical spine were also generated. RADIATION DOSE REDUCTION: This exam was performed according to the departmental dose-optimization program which includes automated exposure control, adjustment of the mA and/or kV according to patient size and/or use of iterative reconstruction technique. COMPARISON:  None Available. FINDINGS: CT HEAD FINDINGS Brain: There is no evidence for acute hemorrhage, hydrocephalus, mass lesion, or abnormal extra-axial fluid collection. No definite CT evidence for acute infarction. Vascular: No hyperdense vessel or unexpected calcification. Skull: Normal. Negative for fracture or focal lesion. Sinuses/Orbits: Tiny air-fluid level noted bilateral sphenoid  sinus. Remaining visualized paranasal sinuses and mastoid air cells are clear. Visualized portions of the globes and intraorbital fat are unremarkable. Other: None. CT CERVICAL SPINE FINDINGS Alignment: Normal. Skull base and vertebrae: No acute fracture. No primary bone lesion or focal pathologic process. Soft tissues and spinal canal: No prevertebral fluid or swelling. No visible canal hematoma. Disc levels: Loss of disc height noted C4-5 and C6-7 with endplate degeneration. Scattered facet  degeneration noted bilaterally with fusion of the C2-3 facets bilaterally. Upper chest: See report for chest CT performed at the same time and dictated separately. Other: None. IMPRESSION: 1. No acute intracranial abnormality. Tiny air-fluid level bilateral sphenoid sinus. Acute paranasal sinusitis could have this appearance. 2. No cervical spine fracture or subluxation. 3. Degenerative changes in the cervical spine as above. Electronically Signed   By: Misty Stanley M.D.   On: 04/04/2022 07:39   CT CERVICAL SPINE WO CONTRAST  Result Date: 04/04/2022 CLINICAL DATA:  Patient fell out of bed and hit right-side. EXAM: CT HEAD WITHOUT CONTRAST CT CERVICAL SPINE WITHOUT CONTRAST TECHNIQUE: Multidetector CT imaging of the head and cervical spine was performed following the standard protocol without intravenous contrast. Multiplanar CT image reconstructions of the cervical spine were also generated. RADIATION DOSE REDUCTION: This exam was performed according to the departmental dose-optimization program which includes automated exposure control, adjustment of the mA and/or kV according to patient size and/or use of iterative reconstruction technique. COMPARISON:  None Available. FINDINGS: CT HEAD FINDINGS Brain: There is no evidence for acute hemorrhage, hydrocephalus, mass lesion, or abnormal extra-axial fluid collection. No definite CT evidence for acute infarction. Vascular: No hyperdense vessel or unexpected calcification. Skull: Normal. Negative for fracture or focal lesion. Sinuses/Orbits: Tiny air-fluid level noted bilateral sphenoid sinus. Remaining visualized paranasal sinuses and mastoid air cells are clear. Visualized portions of the globes and intraorbital fat are unremarkable. Other: None. CT CERVICAL SPINE FINDINGS Alignment: Normal. Skull base and vertebrae: No acute fracture. No primary bone lesion or focal pathologic process. Soft tissues and spinal canal: No prevertebral fluid or swelling. No visible  canal hematoma. Disc levels: Loss of disc height noted C4-5 and C6-7 with endplate degeneration. Scattered facet degeneration noted bilaterally with fusion of the C2-3 facets bilaterally. Upper chest: See report for chest CT performed at the same time and dictated separately. Other: None. IMPRESSION: 1. No acute intracranial abnormality. Tiny air-fluid level bilateral sphenoid sinus. Acute paranasal sinusitis could have this appearance. 2. No cervical spine fracture or subluxation. 3. Degenerative changes in the cervical spine as above. Electronically Signed   By: Misty Stanley M.D.   On: 04/04/2022 07:39   DG Chest Port 1 View  Result Date: 04/04/2022 CLINICAL DATA:  Fall.  Right rib pain. EXAM: PORTABLE CHEST 1 VIEW COMPARISON:  12/23/2017. FINDINGS: Mild cardiac enlargement, unchanged. Lung volumes are low and there is chronic asymmetric elevation of right hemidiaphragm. There is pulmonary vascular congestion noted. No pleural effusion. No airspace consolidation. No signs of pneumothorax. There are degenerative changes noted within both AC joints. Age indeterminate posterior right third and fourth rib fractures. IMPRESSION: 1. Age-indeterminate right third and fourth posterior rib fractures. 2. Cardiac enlargement and pulmonary vascular congestion. 3. Chronic asymmetric elevation of right hemidiaphragm. Electronically Signed   By: Kerby Moors M.D.   On: 04/04/2022 06:22    EKG: Independently reviewed.  Sinus rhythm without acute changes  Assessment/Plan Principal Problem:   Rib fractures Active Problems:   Essential tremor   BPH (benign prostatic hyperplasia)  Dementia ([place])   Hypoxia   Obesity, Class III, BMI 40-49.9 (morbid obesity) ([place])   Fall at home, initial encounter     Multiple right-sided rib fractures Fall at home -CT imaging does not indicate any underlying pulmonary complications -Continue to treat supportively with incentive spirometry, pain management -We will request  physical therapy evaluation  Hypoxia -Likely related to splinting from rib fractures -We will try and wean off oxygen as tolerated  Dementia -Continue Aricept  BPH -Continue Flomax  Essential tremor -Continue on primidone  Obesity, class III -BMI 45.3 -Will need lifestyle changes  DVT prophylaxis: lovenox  Code Status: full code  Family Communication: discussed with son at the bedside  Disposition Plan: discharge home once pain is managed  Consults called:   Admission status: medsurg, observation   Kathie Dike MD [facility]   If 7PM-7AM, please contact night-coverage [email or web address]   04/04/2022, 11:26 AM

## 2022-04-04 NOTE — ED Provider Notes (Signed)
[name] Emergency Department Provider Note MRN:  [id number]  Arrival date & time: 04/04/22     Chief Complaint   Fall   History of Present Illness   Lawrence Reynolds is a 81 y.o. year-old male with a history of dementia presenting to the ED with chief complaint of fall.  [place] [address] out of bed and hit the right side ribs on something.  Patient has dementia, history obtained from family at bedside.  They do not think he hit his head, does not take blood thinners, only complaining of right-sided rib pain.  Review of Systems  A thorough review of systems was obtained and all systems are negative except as noted in the HPI and PMH.   Patient's Health History    Past Medical History:  Diagnosis Date   BPH (benign prostatic hypertrophy)    Diverticulitis    Headache(784.0)     Past Surgical History:  Procedure Laterality Date   CHOLECYSTECTOMY     COLONOSCOPY  2008   Dr. Gala Romney: Tubular adenomas, diverticulosis.   HERNIA REPAIR     twice   JOINT REPLACEMENT     SMALL INTESTINE SURGERY     ???? Per patient for diverticulitis   TOTAL KNEE ARTHROPLASTY      Family History  Problem Relation Age of Onset   Cancer Brother        lung   Seizures Brother    Dementia Brother    Colon cancer Paternal Uncle    Prostate cancer Paternal Uncle    Colon cancer Other        nephew    Social History   Socioeconomic History   Marital status: Married    Spouse name: Not on file   Number of children: Not on file   Years of education: Not on file   Highest education level: Not on file  Occupational History   Not on file  Tobacco Use   Smoking status: Former   Smokeless tobacco: Never  Substance and Sexual Activity   Alcohol use: No   Drug use: No   Sexual activity: Never  Other Topics Concern   Not on file  Social History Narrative   Not on file   Social Determinants of Health   Financial Resource Strain: Not on file  Food Insecurity: Not on file   Transportation Needs: Not on file  Physical Activity: Not on file  Stress: Not on file  Social Connections: Not on file  Intimate Partner Violence: Not on file     Physical Exam   Vitals:   04/04/22 0524 04/04/22 0600  BP: (!) 143/82 121/67  Pulse: 69 69  Resp: 18 (!) 21  Temp: 97.7 F (36.5 C)   SpO2: 92% 92%    CONSTITUTIONAL: Well-appearing, in mild distress due to pain NEURO/PSYCH: Awake, oriented to name only, can somewhat answer questions EYES:  eyes equal and reactive ENT/NECK:  no LAD, no JVD CARDIO: Regular rate, well-perfused, normal S1 and S2 PULM:  CTAB no wheezing or rhonchi GI/GU:  non-distended, non-tender MSK/SPINE:  No gross deformities, no edema SKIN:  no rash, atraumatic   *Additional and/or pertinent findings included in MDM below  Diagnostic and Interventional Summary    EKG Interpretation  Date/Time:  Sunday April 04 2022 05:30:23 EDT Ventricular Rate:  69 PR Interval:  191 QRS Duration: 94 QT Interval:  401 QTC Calculation: 430 R Axis:   -11 Text Interpretation: Sinus rhythm Low voltage, extremity and precordial leads Consider  anterior infarct Nonspecific T abnormalities, lateral leads Baseline wander in lead(s) V3 Confirmed by Gerlene Fee [id number]) on 04/04/2022 5:44:21 AM       Labs Reviewed - No data to display  DG Chest Port 1 View  Final Result    CT HEAD WO CONTRAST (5MM)    (Results Pending)  CT CERVICAL SPINE WO CONTRAST    (Results Pending)  CT CHEST ABDOMEN PELVIS WO CONTRAST    (Results Pending)    Medications  HYDROcodone-acetaminophen (NORCO/VICODIN) 5-325 MG per tablet 1 tablet (1 tablet Oral Given 04/04/22 0620)  HYDROmorphone (DILAUDID) injection 1 mg (1 mg Intramuscular Given 04/04/22 8550)     Procedures  /  Critical Care Procedures  ED Course and Medical Decision Making  Initial Impression and Ddx Mechanical fall, rib pain.  Awaiting chest x-ray to evaluate for pneumothorax or signs of rib fractures.  Will  reevaluate after pain medicine to see if he needs any advanced imaging.  Past medical/surgical history that increases complexity of ED encounter: Dementia  Interpretation of Diagnostics I personally reviewed the chest x-ray and my interpretation is as follows: No pneumothorax  Radiology commenting on age-indeterminate rib fractures  Patient Reassessment and Ultimate Disposition/Management     On reassessment patient still tachypneic and grimacing in pain.  Given this and risk factors, will obtain advanced imaging to exclude significant traumatic injury to the liver given the location of patient's pain in the right upper quadrant.  Signed out to oncoming provider.  Patient management required discussion with the following services or consulting groups:  None  Complexity of Problems Addressed Acute illness or injury that poses threat of life of bodily function  Additional Data Reviewed and Analyzed Further history obtained from: Further history from spouse/family member  Additional Factors Impacting ED Encounter Risk Use of parenteral controlled substances  Barth Kirks. Sedonia Small, [place] [email or web address]  Final Clinical Impressions(s) / ED Diagnoses     ICD-10-CM   1. Fall, initial encounter  W[facility]       ED Discharge Orders     None        Discharge Instructions Discussed with and Provided to Patient:   Discharge Instructions   None      Maudie Flakes, MD 04/04/22 [phone]

## 2022-04-04 NOTE — ED Triage Notes (Signed)
Pt rolled out of bed and is c/o pain in Right rib area.

## 2022-04-04 NOTE — ED Provider Notes (Signed)
Care of patient assumed from Dr. Sedonia Small at 7 AM.  This patient, with history of dementia, fell out of bed onto the right chest wall.  Currently awaiting imaging studies.  He currently lives at home with family support.  Physical Exam  BP 122/61   Pulse 72   Temp 97.7 F (36.5 C) (Oral)   Resp 19   Ht '5\' 8"'$  (1.727 m)   Wt 135.2 kg   SpO2 92%   BMI 45.31 kg/m   Physical Exam Vitals and nursing note reviewed.  Constitutional:      General: He is not in acute distress.    Appearance: Normal appearance. He is well-developed. He is not ill-appearing, toxic-appearing or diaphoretic.  HENT:     Head: Normocephalic and atraumatic.     Right Ear: External ear normal.     Left Ear: External ear normal.     Nose: Nose normal.     Mouth/Throat:     Mouth: Mucous membranes are moist.     Pharynx: Oropharynx is clear.  Eyes:     Conjunctiva/sclera: Conjunctivae normal.  Cardiovascular:     Rate and Rhythm: Normal rate and regular rhythm.     Heart sounds: No murmur heard. Pulmonary:     Effort: Pulmonary effort is normal. No respiratory distress.  Chest:     Chest wall: Tenderness present.  Abdominal:     General: There is no distension.     Palpations: Abdomen is soft.     Tenderness: There is no abdominal tenderness.  Musculoskeletal:        General: Normal range of motion.     Cervical back: Neck supple.  Skin:    General: Skin is warm and dry.     Capillary Refill: Capillary refill takes less than 2 seconds.  Neurological:     General: No focal deficit present.     Mental Status: He is alert. Mental status is at baseline.  Psychiatric:        Mood and Affect: Mood normal.        Behavior: Behavior normal.     Procedures  Procedures  ED Course / MDM    Medical Decision Making Amount and/or Complexity of Data Reviewed Labs: ordered. Radiology: ordered.  Risk Prescription drug management. Decision regarding hospitalization.   CT imaging revealed 4 consecutive rib  fractures on the right side.  On assessment, patient states that his pain is currently controlled.  His SPO2 is 85% on room air.  He is not on home oxygen.  Patient was placed on 2 L of supplemental oxygen by nasal cannula.  Laboratory work-up was initiated in anticipation of admission.  I spoke with trauma surgeon on-call, Dr. Grandville Silos.  Dr. Grandville Silos feels that patient would be appropriate for admission at [facility] for pain control and pulmonary toilet.  Patient was admitted for further management.       Godfrey Pick, MD 04/04/22 [phone]

## 2022-04-04 NOTE — ED Notes (Signed)
EDP at [facility]. Wife at [facility]. Incentive spirometer demonstrated by pt well, tolerated well, no apparent discomfort with effort, 1734m x1.

## 2022-04-05 DIAGNOSIS — F0280 Dementia in other diseases classified elsewhere without behavioral disturbance: Secondary | ICD-10-CM | POA: Diagnosis not present

## 2022-04-05 DIAGNOSIS — R0902 Hypoxemia: Secondary | ICD-10-CM | POA: Diagnosis not present

## 2022-04-05 DIAGNOSIS — N400 Enlarged prostate without lower urinary tract symptoms: Secondary | ICD-10-CM | POA: Diagnosis not present

## 2022-04-05 DIAGNOSIS — S2241XA Multiple fractures of ribs, right side, initial encounter for closed fracture: Secondary | ICD-10-CM | POA: Diagnosis not present

## 2022-04-05 DIAGNOSIS — G309 Alzheimer's disease, unspecified: Secondary | ICD-10-CM | POA: Diagnosis not present

## 2022-04-05 DIAGNOSIS — G250 Essential tremor: Secondary | ICD-10-CM | POA: Diagnosis not present

## 2022-04-05 LAB — CBC
HCT: 44.7 % (ref 39.0–52.0)
Hemoglobin: 14.4 g/dL (ref 13.0–17.0)
MCH: 31.2 pg (ref 26.0–34.0)
MCHC: 32.2 g/dL (ref 30.0–36.0)
MCV: 96.8 fL (ref 80.0–100.0)
Platelets: 204 10*3/uL (ref 150–400)
RBC: 4.62 MIL/uL (ref 4.22–5.81)
RDW: 13.8 % (ref 11.5–15.5)
WBC: 12.3 10*3/uL — ABNORMAL HIGH (ref 4.0–10.5)
nRBC: 0 % (ref 0.0–0.2)

## 2022-04-05 MED ORDER — HYDROCODONE-ACETAMINOPHEN 5-325 MG PO TABS: 1.0000 | ORAL_TABLET | ORAL | 0 refills | Status: DC | PRN

## 2022-04-05 NOTE — Discharge Summary (Signed)
Physician Discharge Summary  Lawrence Reynolds KDX:[id number] DOB: 21-May-1941 DOA: 04/04/2022  PCP: Monico Blitz, MD  Admit date: 04/04/2022 Discharge date: 04/05/2022  Admitted From: Home Disposition: Home  Recommendations for Outpatient Follow-up:  Follow up with PCP in 1-2 weeks Please obtain BMP/CBC in one week  Home Health: Seen by Lawrence Reynolds/OT with recommendations for skilled nursing facility.  Family has declined placement and is electing to take the patient home with home health services.  He has been set up with home health Lawrence Reynolds, OT and aide Equipment/Devices: Walker, oxygen at 2 L  Discharge Condition: Stable CODE STATUS: Full code Diet recommendation: Heart healthy  Brief/Interim Summary: Lawrence Reynolds is a 81 y.o. male with medical history significant of dementia, BPH, essential tremor, lives at home with his wife.  Patient is normally ambulatory at baseline.  Patient was reportedly in his usual state of health when he suffered a fall on the night of admission.  This was an unwitnessed fall, but family feels that he may have slid out of bed.  He landed on his right side.  He was brought to the hospital for evaluation where imaging showed several uncomplicated right-sided rib fractures.  CT imaging of his head and neck were unremarkable.  He was also noted to be mildly hypoxic on room air at 85%.  He was having significant pain and received pain medications.  Admission requested for observation for hypoxia and pain management.  Patient was monitored in the hospital overnight.  Overall pain appears to be reasonably controlled with oral pain medications.  He is noted to have persistent hypoxia on ambulation.  He has been set up with home oxygen.  WBC count has improved with some IV fluids.  He does not have any fever.  Otherwise appears to be comfortable at rest.  Seen by Lawrence Reynolds/OT with recommendations for skilled nursing facility placement.  Family has declined placement and is electing to  discharge patient home with home health services.  Discharge Diagnoses:  Principal Problem:   Rib fractures Active Problems:   Essential tremor   BPH (benign prostatic hyperplasia)   Dementia (HCC)   Hypoxia   Obesity, Class III, BMI 40-49.9 (morbid obesity) ([place])   Fall at home, initial encounter    Discharge Instructions  Discharge Instructions     Diet - low sodium heart healthy   Complete by: As directed    Increase activity slowly   Complete by: As directed       Allergies as of 04/05/2022   No Known Allergies      Medication List     TAKE these medications    butalbital-acetaminophen-caffeine 50-325-40 MG tablet Commonly known as: FIORICET Take 1 tablet by mouth every 6 (six) hours as needed for headache.   cholecalciferol 1000 units tablet Commonly known as: VITAMIN D Take 2,000 Units by mouth daily.   cyanocobalamin 1000 MCG tablet Commonly known as: VITAMIN B12 Take 1 tablet (1,000 mcg total) by mouth daily.   donepezil 10 MG tablet Commonly known as: ARICEPT Take 1 tablet by mouth daily.   EPINEPHrine 0.3 mg/0.3 mL Soaj injection Commonly known as: EpiPen Inject 1 mL (1 mg total) into the muscle once.   gabapentin 300 MG capsule Commonly known as: NEURONTIN Take 300 mg by mouth 2 (two) times daily.   HYDROcodone-acetaminophen 5-325 MG tablet Commonly known as: NORCO/VICODIN Take 1-2 tablets by mouth every 4 (four) hours as needed for moderate pain.   primidone 50 MG tablet Commonly known as:  MYSOLINE Take 100 mg by mouth 2 (two) times daily.   tamsulosin 0.4 MG Caps capsule Commonly known as: FLOMAX Take 0.4 mg by mouth every evening.   VITAMIN E PO Take 1 tablet by mouth daily.               Durable Medical Equipment  (From admission, onward)           Start     Ordered   04/05/22 1235  For home use only DME Walker rolling  Once       Question Answer Comment  Walker: With [place] Wheels   Patient needs a walker to  treat with the following condition Generalized weakness      04/05/22 1235   04/05/22 1137  For home use only DME Walker rolling  Once       Question Answer Comment  Walker: With [place] Wheels   Patient needs a walker to treat with the following condition Abnormality of gait and mobility   Patient needs a walker to treat with the following condition Weakness generalized      04/05/22 1137   04/05/22 1005  For home use only DME oxygen  Once       Question Answer Comment  Length of Need 6 Months   Mode or (Route) Nasal cannula   Liters per Minute 2   Frequency Continuous (stationary and portable oxygen unit needed)   Oxygen conserving device Yes   Oxygen delivery system Gas      04/05/22 1004            No Known Allergies  Consultations:    Procedures/Studies: CT CHEST ABDOMEN PELVIS WO CONTRAST  Result Date: 04/04/2022 CLINICAL DATA:  [place] out of bed. Hit the right side of ribs on something. History of dementia. EXAM: CT CHEST, ABDOMEN AND PELVIS WITHOUT CONTRAST TECHNIQUE: Multidetector CT imaging of the chest, abdomen and pelvis was performed following the standard protocol without IV contrast. RADIATION DOSE REDUCTION: This exam was performed according to the departmental dose-optimization program which includes automated exposure control, adjustment of the mA and/or kV according to patient size and/or use of iterative reconstruction technique. COMPARISON:  CT AP 12/22/2017. FINDINGS: CT CHEST FINDINGS Cardiovascular: Heart size is within normal limits. Aortic atherosclerosis. Coronary artery calcifications. No pericardial effusion. Mediastinum/Nodes: No enlarged mediastinal, hilar, or axillary lymph nodes. Thyroid gland, trachea, and esophagus demonstrate no significant findings. Lungs/Pleura: Paraseptal and centrilobular emphysema. There is no pleural effusion. Signs chronic postinflammatory changes identified within bilateral lower lung zones including cylindrical  bronchiectasis, interstitial reticulation, parenchymal banding, architectural distortion, and volume loss. No acute airspace consolidation identified. No signs of pneumothorax. 5 mm right upper lobe lung nodule, image 50/3. Musculoskeletal: Multiple right rib fractures are identified including: -Anterolateral right second rib -Anterior and posterior right third rib -Anterior and posterior right fourth rib -Lateral and posterior right fifth rib. The thoracic vertebral bodies are well preserved without signs of fracture. Sternum is intact. CT ABDOMEN PELVIS FINDINGS Hepatobiliary: No suspicious liver abnormality. Status post cholecystectomy. No bile duct dilatation. Pancreas: There are scattered parenchymal calcifications throughout the pancreas compatible with changes due to chronic pancreatitis. Cystic lesion within the posterior head of pancreas is again noted measuring 2 cm, image 73/2. Incompletely characterized on today's unenhanced study. Spleen: Normal in size without focal abnormality. Adrenals/Urinary Tract: Normal adrenal glands. No nephrolithiasis or hydronephrosis. Cyst within the medial cortex of the right kidney measures 2.6 cm, image 70/2. No follow-up recommended. No nephrolithiasis or  hydronephrosis. Urinary bladder is unremarkable. Stomach/Bowel: Stomach appears normal. No bowel wall thickening, inflammation, or distension. Vascular/Lymphatic: Aortic atherosclerosis. No aneurysm. No signs of abdominopelvic adenopathy. Reproductive: Normal prostate gland. Other: Small fat containing left inguinal hernia. No free fluid or fluid collections. No pneumoperitoneum. Musculoskeletal: The lumbar vertebral body heights are well preserved. No acute or suspicious osseous findings. IMPRESSION: 1. Multiple right-sided rib fractures are identified. No signs of pneumothorax. 2. Chronic postinflammatory changes within bilateral lower lung zones. 3. Coronary artery calcifications. 4. 2 cm stable cystic lesion within  the posterior head of pancreas is again noted with changes consistent with chronic pancreatitis. 5. 5 mm right solid pulmonary nodule within the upper lobe. Per Fleischner Society Guidelines, a non-contrast Chest CT at 12 months is optional. If performed and the nodule is stable at 12 months, no further follow-up is recommended. These guidelines do not apply to immunocompromised patients and patients with cancer. Follow up in patients with significant comorbidities as clinically warranted. For lung cancer screening, adhere to Lung-RADS guidelines. Reference: Radiology. 2017; 284(1):228-43. 6. Aortic Atherosclerosis (ICD10-I70.0) and Emphysema (ICD10-J43.9). Electronically Signed   By: Kerby Moors M.D.   On: 04/04/2022 07:49   CT HEAD WO CONTRAST (5MM)  Result Date: 04/04/2022 CLINICAL DATA:  Patient fell out of bed and hit right-side. EXAM: CT HEAD WITHOUT CONTRAST CT CERVICAL SPINE WITHOUT CONTRAST TECHNIQUE: Multidetector CT imaging of the head and cervical spine was performed following the standard protocol without intravenous contrast. Multiplanar CT image reconstructions of the cervical spine were also generated. RADIATION DOSE REDUCTION: This exam was performed according to the departmental dose-optimization program which includes automated exposure control, adjustment of the mA and/or kV according to patient size and/or use of iterative reconstruction technique. COMPARISON:  None Available. FINDINGS: CT HEAD FINDINGS Brain: There is no evidence for acute hemorrhage, hydrocephalus, mass lesion, or abnormal extra-axial fluid collection. No definite CT evidence for acute infarction. Vascular: No hyperdense vessel or unexpected calcification. Skull: Normal. Negative for fracture or focal lesion. Sinuses/Orbits: Tiny air-fluid level noted bilateral sphenoid sinus. Remaining visualized paranasal sinuses and mastoid air cells are clear. Visualized portions of the globes and intraorbital fat are unremarkable.  Other: None. CT CERVICAL SPINE FINDINGS Alignment: Normal. Skull base and vertebrae: No acute fracture. No primary bone lesion or focal pathologic process. Soft tissues and spinal canal: No prevertebral fluid or swelling. No visible canal hematoma. Disc levels: Loss of disc height noted C4-5 and C6-7 with endplate degeneration. Scattered facet degeneration noted bilaterally with fusion of the C2-3 facets bilaterally. Upper chest: See report for chest CT performed at the same time and dictated separately. Other: None. IMPRESSION: 1. No acute intracranial abnormality. Tiny air-fluid level bilateral sphenoid sinus. Acute paranasal sinusitis could have this appearance. 2. No cervical spine fracture or subluxation. 3. Degenerative changes in the cervical spine as above. Electronically Signed   By: Misty Stanley M.D.   On: 04/04/2022 07:39   CT CERVICAL SPINE WO CONTRAST  Result Date: 04/04/2022 CLINICAL DATA:  Patient fell out of bed and hit right-side. EXAM: CT HEAD WITHOUT CONTRAST CT CERVICAL SPINE WITHOUT CONTRAST TECHNIQUE: Multidetector CT imaging of the head and cervical spine was performed following the standard protocol without intravenous contrast. Multiplanar CT image reconstructions of the cervical spine were also generated. RADIATION DOSE REDUCTION: This exam was performed according to the departmental dose-optimization program which includes automated exposure control, adjustment of the mA and/or kV according to patient size and/or use of iterative reconstruction technique. COMPARISON:  None Available.  FINDINGS: CT HEAD FINDINGS Brain: There is no evidence for acute hemorrhage, hydrocephalus, mass lesion, or abnormal extra-axial fluid collection. No definite CT evidence for acute infarction. Vascular: No hyperdense vessel or unexpected calcification. Skull: Normal. Negative for fracture or focal lesion. Sinuses/Orbits: Tiny air-fluid level noted bilateral sphenoid sinus. Remaining visualized paranasal  sinuses and mastoid air cells are clear. Visualized portions of the globes and intraorbital fat are unremarkable. Other: None. CT CERVICAL SPINE FINDINGS Alignment: Normal. Skull base and vertebrae: No acute fracture. No primary bone lesion or focal pathologic process. Soft tissues and spinal canal: No prevertebral fluid or swelling. No visible canal hematoma. Disc levels: Loss of disc height noted C4-5 and C6-7 with endplate degeneration. Scattered facet degeneration noted bilaterally with fusion of the C2-3 facets bilaterally. Upper chest: See report for chest CT performed at the same time and dictated separately. Other: None. IMPRESSION: 1. No acute intracranial abnormality. Tiny air-fluid level bilateral sphenoid sinus. Acute paranasal sinusitis could have this appearance. 2. No cervical spine fracture or subluxation. 3. Degenerative changes in the cervical spine as above. Electronically Signed   By: Misty Stanley M.D.   On: 04/04/2022 07:39   DG Chest Port 1 View  Result Date: 04/04/2022 CLINICAL DATA:  Fall.  Right rib pain. EXAM: PORTABLE CHEST 1 VIEW COMPARISON:  12/23/2017. FINDINGS: Mild cardiac enlargement, unchanged. Lung volumes are low and there is chronic asymmetric elevation of right hemidiaphragm. There is pulmonary vascular congestion noted. No pleural effusion. No airspace consolidation. No signs of pneumothorax. There are degenerative changes noted within both AC joints. Age indeterminate posterior right third and fourth rib fractures. IMPRESSION: 1. Age-indeterminate right third and fourth posterior rib fractures. 2. Cardiac enlargement and pulmonary vascular congestion. 3. Chronic asymmetric elevation of right hemidiaphragm. Electronically Signed   By: Kerby Moors M.D.   On: 04/04/2022 06:22      Subjective: Currently sitting up in chair, denies any worsening pain.  Denies any shortness of breath.  Discharge Exam: Vitals:   04/04/22 1000 04/04/22 1334 04/04/22 2142 04/05/22 0538   BP: (!) 131/58 137/63 138/65 131/69  Pulse: 73 68 80 69  Resp: '18 20 12 18  '$ Temp: 98.3 F (36.8 C) 98.7 F (37.1 C) 99.1 F (37.3 C) 98.8 F (37.1 C)  TempSrc: Oral Oral  Oral  SpO2: 100% 96% 92% 90%  Weight:      Height:        General: Lawrence Reynolds is alert, awake, not in acute distress Cardiovascular: RRR, S1/S2 +, no rubs, no gallops Respiratory: CTA bilaterally, no wheezing, no rhonchi Abdominal: Soft, NT, ND, bowel sounds + Extremities: no edema, no cyanosis    The results of significant diagnostics from this hospitalization (including imaging, microbiology, ancillary and laboratory) are listed below for reference.     Microbiology: Recent Results (from the past 240 hour(s))  Resp Panel by RT-PCR (Flu A&B, Covid) Anterior Nasal Swab     Status: None   Collection Time: 04/04/22  8:41 AM   Specimen: Anterior Nasal Swab  Result Value Ref Range Status   SARS Coronavirus 2 by RT PCR NEGATIVE NEGATIVE Final    Comment: (NOTE) SARS-CoV-2 target nucleic acids are NOT DETECTED.  The SARS-CoV-2 RNA is generally detectable in upper respiratory specimens during the acute phase of infection. The lowest concentration of SARS-CoV-2 viral copies this assay can detect is 138 copies/mL. A negative result does not preclude SARS-Cov-2 infection and should not be used as the sole basis for treatment or other patient management  decisions. A negative result may occur with  improper specimen collection/handling, submission of specimen other than nasopharyngeal swab, presence of viral mutation(s) within the areas targeted by this assay, and inadequate number of viral copies(<138 copies/mL). A negative result must be combined with clinical observations, patient history, and epidemiological information. The expected result is Negative.  Fact Sheet for Patients:  EntrepreneurPulse.com.au  Fact Sheet for Healthcare Providers:  IncredibleEmployment.be  This  test is no t yet approved or cleared by the [place] FDA and  has been authorized for detection and/or diagnosis of SARS-CoV-2 by FDA under an Emergency Use Authorization (EUA). This EUA will remain  in effect (meaning this test can be used) for the duration of the COVID-19 declaration under Section 564(b)(1) of the Act, 21 U.S.C.section 360bbb-3(b)(1), unless the authorization is terminated  or revoked sooner.       Influenza A by PCR NEGATIVE NEGATIVE Final   Influenza B by PCR NEGATIVE NEGATIVE Final    Comment: (NOTE) The Xpert Xpress SARS-CoV-2/FLU/RSV plus assay is intended as an aid in the diagnosis of influenza from Nasopharyngeal swab specimens and should not be used as a sole basis for treatment. Nasal washings and aspirates are unacceptable for Xpert Xpress SARS-CoV-2/FLU/RSV testing.  Fact Sheet for Patients: EntrepreneurPulse.com.au  Fact Sheet for Healthcare Providers: IncredibleEmployment.be  This test is not yet approved or cleared by the [place] FDA and has been authorized for detection and/or diagnosis of SARS-CoV-2 by FDA under an Emergency Use Authorization (EUA). This EUA will remain in effect (meaning this test can be used) for the duration of the COVID-19 declaration under Section 564(b)(1) of the Act, 21 U.S.C. section 360bbb-3(b)(1), unless the authorization is terminated or revoked.  Performed at [facility], [address] [place], [place] [address]      Labs: BNP (last 3 results) No results for input(s): "BNP" in the last 8760 hours. Basic Metabolic Panel: Recent Labs  Lab 04/04/22 0825  NA 137  K 4.1  CL 102  CO2 27  GLUCOSE 126*  BUN 18  CREATININE 0.93  CALCIUM 8.9   Liver Function Tests: Recent Labs  Lab 04/04/22 0825  AST 29  ALT 24  ALKPHOS 65  BILITOT 0.9  PROT 7.4  ALBUMIN 4.0   No results for input(s): "LIPASE", "AMYLASE" in the last 168 hours. No results for input(s):  "AMMONIA" in the last 168 hours. CBC: Recent Labs  Lab 04/04/22 0825 04/05/22 0413  WBC 17.0* 12.3*  HGB 15.4 14.4  HCT 47.1 44.7  MCV 96.1 96.8  PLT 232 204   Cardiac Enzymes: No results for input(s): "CKTOTAL", "CKMB", "CKMBINDEX", "TROPONINI" in the last 168 hours. BNP: Invalid input(s): "POCBNP" CBG: No results for input(s): "GLUCAP" in the last 168 hours. D-Dimer No results for input(s): "DDIMER" in the last 72 hours. Hgb A1c No results for input(s): "HGBA1C" in the last 72 hours. Lipid Profile No results for input(s): "CHOL", "HDL", "LDLCALC", "TRIG", "CHOLHDL", "LDLDIRECT" in the last 72 hours. Thyroid function studies No results for input(s): "TSH", "T4TOTAL", "T3FREE", "THYROIDAB" in the last 72 hours.  Invalid input(s): "FREET3" Anemia work up No results for input(s): "VITAMINB12", "FOLATE", "FERRITIN", "TIBC", "IRON", "RETICCTPCT" in the last 72 hours. Urinalysis    Component Value Date/Time   COLORURINE YELLOW 04/04/2022 1209   APPEARANCEUR CLEAR 04/04/2022 1209   LABSPEC 1.023 04/04/2022 1209   PHURINE 5.0 04/04/2022 1209   GLUCOSEU NEGATIVE 04/04/2022 1209   HGBUR SMALL (A) 04/04/2022 1209   HGBUR negative 01/01/2008 0000  BILIRUBINUR NEGATIVE 04/04/2022 1209   KETONESUR NEGATIVE 04/04/2022 1209   PROTEINUR NEGATIVE 04/04/2022 1209   UROBILINOGEN 0.2 10/14/2013 0835   NITRITE NEGATIVE 04/04/2022 1209   LEUKOCYTESUR NEGATIVE 04/04/2022 1209   Sepsis Labs Recent Labs  Lab 04/04/22 0825 04/05/22 0413  WBC 17.0* 12.3*   Microbiology Recent Results (from the past 240 hour(s))  Resp Panel by RT-PCR (Flu A&B, Covid) Anterior Nasal Swab     Status: None   Collection Time: 04/04/22  8:41 AM   Specimen: Anterior Nasal Swab  Result Value Ref Range Status   SARS Coronavirus 2 by RT PCR NEGATIVE NEGATIVE Final    Comment: (NOTE) SARS-CoV-2 target nucleic acids are NOT DETECTED.  The SARS-CoV-2 RNA is generally detectable in upper  respiratory specimens during the acute phase of infection. The lowest concentration of SARS-CoV-2 viral copies this assay can detect is 138 copies/mL. A negative result does not preclude SARS-Cov-2 infection and should not be used as the sole basis for treatment or other patient management decisions. A negative result may occur with  improper specimen collection/handling, submission of specimen other than nasopharyngeal swab, presence of viral mutation(s) within the areas targeted by this assay, and inadequate number of viral copies(<138 copies/mL). A negative result must be combined with clinical observations, patient history, and epidemiological information. The expected result is Negative.  Fact Sheet for Patients:  EntrepreneurPulse.com.au  Fact Sheet for Healthcare Providers:  IncredibleEmployment.be  This test is no t yet approved or cleared by the [place] FDA and  has been authorized for detection and/or diagnosis of SARS-CoV-2 by FDA under an Emergency Use Authorization (EUA). This EUA will remain  in effect (meaning this test can be used) for the duration of the COVID-19 declaration under Section 564(b)(1) of the Act, 21 U.S.C.section 360bbb-3(b)(1), unless the authorization is terminated  or revoked sooner.       Influenza A by PCR NEGATIVE NEGATIVE Final   Influenza B by PCR NEGATIVE NEGATIVE Final    Comment: (NOTE) The Xpert Xpress SARS-CoV-2/FLU/RSV plus assay is intended as an aid in the diagnosis of influenza from Nasopharyngeal swab specimens and should not be used as a sole basis for treatment. Nasal washings and aspirates are unacceptable for Xpert Xpress SARS-CoV-2/FLU/RSV testing.  Fact Sheet for Patients: EntrepreneurPulse.com.au  Fact Sheet for Healthcare Providers: IncredibleEmployment.be  This test is not yet approved or cleared by the [place] FDA and has been  authorized for detection and/or diagnosis of SARS-CoV-2 by FDA under an Emergency Use Authorization (EUA). This EUA will remain in effect (meaning this test can be used) for the duration of the COVID-19 declaration under Section 564(b)(1) of the Act, 21 U.S.C. section 360bbb-3(b)(1), unless the authorization is terminated or revoked.  Performed at [facility], [address] [place],  [address]      Time coordinating discharge: 53mns  SIGNED:   JKathie Dike MD  [facility] 04/05/2022, 12:36 PM   If 7PM-7AM, please contact night-coverage [email or web address]

## 2022-04-05 NOTE — Plan of Care (Signed)
  Problem: Acute Rehab PT Goals(only PT should resolve) Goal: Pt Will Go Supine/Side To Sit Outcome: Progressing Flowsheets (Taken 04/05/2022 1036) Pt will go Supine/Side to Sit: with minimal assist Goal: Patient Will Transfer Sit To/From Stand Outcome: Progressing Flowsheets (Taken 04/05/2022 1036) Patient will transfer sit to/from stand: with min guard assist Goal: Pt Will Transfer Bed To Chair/Chair To Bed Outcome: Progressing Flowsheets (Taken 04/05/2022 1036) Pt will Transfer Bed to Chair/Chair to Bed: with min assist Goal: Pt Will Ambulate Outcome: Progressing Flowsheets (Taken 04/05/2022 1036) Pt will Ambulate:  100 feet  with rolling walker  with min guard assist

## 2022-04-05 NOTE — Evaluation (Addendum)
Occupational Therapy Evaluation Patient Details Name: Lawrence Reynolds MRN: [id number] DOB: 06-Sep-1940 Today's Date: 04/05/2022   History of Present Illness Lawrence Reynolds is a 81 y.o. male with medical history significant of dementia, BPH, essential tremor, lives at home with his wife.  Patient is normally ambulatory at baseline.  Patient was reportedly in his usual state of health when he suffered a fall on the night of admission.  This was an unwitnessed fall, but family feels that he may have slid out of bed.  He landed on his right side.  He was brought to the hospital for evaluation where imaging showed several uncomplicated right-sided rib fractures.  CT imaging of his head and neck were unremarkable.  He was also noted to be mildly hypoxic on room air at 85%.  He was having significant pain and received pain medications.  Admission requested for observation for hypoxia and pain management.   Clinical Impression   Pt agreeable to OT and PT co-evaluation. Pt presents with pain in R flank area during movement and difficulty tolerating supine position due to pain. Pt requires mod to max A for bed mobility at this time. Once upright in bed pt is able to boost to stand with RW with more min G to min A. Bed mobility is the most limiting factor at this time. Pt has support from wife but she has back problems. Pt also requires much assist for lower body dressing at this time. Pt was taken off 2 L supplemental O2 ans was noted desaturate to ~62% SpO2 while ambulating. Upon return to sitting at EOB pt quickly rose back to above 90% with return to supplemental O2. Pt was left in bed with family present. Pt will benefit from continued OT in the hospital and recommended venue below to increase strength, balance, and endurance for safe ADL's.        Recommendations for follow up therapy are one component of a multi-disciplinary discharge planning process, led by the attending physician.  Recommendations  may be updated based on patient status, additional functional criteria and insurance authorization.   Follow Up Recommendations  Skilled nursing-short term rehab (<3 hours/day)    Assistance Recommended at Discharge Intermittent Supervision/Assistance  Patient can return home with the following A little help with walking and/or transfers;A lot of help with bathing/dressing/bathroom;Assistance with cooking/housework;Assist for transportation;Help with stairs or ramp for entrance    Functional Status Assessment  Patient has had a recent decline in their functional status and demonstrates the ability to make significant improvements in function in a reasonable and predictable amount of time.  Equipment Recommendations  None recommended by OT    Recommendations for Other Services       Precautions / Restrictions Precautions Precautions: Fall Restrictions Weight Bearing Restrictions: No      Mobility Bed Mobility Overal bed mobility: Needs Assistance Bed Mobility: Supine to Sit, Sit to Supine     Supine to sit: Mod assist, Max assist, HOB elevated Sit to supine: Mod assist, Max assist   General bed mobility comments: Pt struggle with increased pain and difficulty moving B LE and pulling to sit. Much assist needed.    Transfers Overall transfer level: Needs assistance Equipment used: Rolling walker (2 wheels) Transfers: Sit to/from Stand, Bed to chair/wheelchair/BSC Sit to Stand: Min guard, Min assist     Step pivot transfers: Min guard, Min assist     General transfer comment: Pt able to boost from bed with min A and ambulate in  hall prior to return to bed using RW.      Balance Overall balance assessment: Needs assistance Sitting-balance support: No upper extremity supported, Feet supported Sitting balance-Leahy Scale: Good Sitting balance - Comments: seated EOB   Standing balance support: Bilateral upper extremity supported, During functional activity, Reliant on  assistive device for balance Standing balance-Leahy Scale: Poor Standing balance comment: poor to fair with RW                           ADL either performed or assessed with clinical judgement   ADL Overall ADL's : Needs assistance/impaired     Grooming: Min guard;Standing;Minimal assistance   Upper Body Bathing: Min guard;Minimal assistance;Sitting   Lower Body Bathing: Maximal assistance;Sitting/lateral leans   Upper Body Dressing : Minimal assistance;Min guard   Lower Body Dressing: Maximal assistance;Sitting/lateral leans Lower Body Dressing Details (indicate cue type and reason): Pt unable to doff for don sock seated at EOB. Does not complete at baseline. Toilet Transfer: Moderate assistance;Rolling walker (2 wheels);Ambulation Toilet Transfer Details (indicate cue type and reason): Simulated via sit to stand, ambulation in hall, and return to bed using RW. Toileting- Clothing Manipulation and Hygiene: Sit to/from stand;Minimal assistance       Functional mobility during ADLs: Min guard;Minimal assistance;Rolling walker (2 wheels)       Vision Baseline Vision/History: 1 Wears glasses Ability to See in Adequate Light: 0 Adequate Patient Visual Report: No change from baseline Vision Assessment?: No apparent visual deficits                Pertinent Vitals/Pain Pain Assessment Pain Assessment: Faces Faces Pain Scale: Hurts even more Pain Location: R flank Pain Descriptors / Indicators: Grimacing, Guarding Pain Intervention(s): Limited activity within patient's tolerance, Monitored during session, Repositioned     Hand Dominance Right   Extremity/Trunk Assessment Upper Extremity Assessment Upper Extremity Assessment: Overall WFL for tasks assessed   Lower Extremity Assessment Lower Extremity Assessment: Defer to PT evaluation   Cervical / Trunk Assessment Cervical / Trunk Assessment: Normal   Communication Communication Communication: No  difficulties   Cognition Arousal/Alertness: Awake/alert Behavior During Therapy: WFL for tasks assessed/performed Overall Cognitive Status: Within Functional Limits for tasks assessed                                                        Home Living Family/patient expects to be discharged to:: Private residence Living Arrangements: Spouse/significant other Available Help at Discharge: Family;Available 24 hours/day Type of Home: House Home Access: Stairs to enter [employer] of Steps: 1 step and 1 threshold into house Entrance Stairs-Rails:  (Pt has bar on L side going in.) Home Layout: One level     Bathroom Shower/Tub: Tub/shower unit (Pt reportedly does not bathe often.)   Bathroom Toilet: Standard Bathroom Accessibility: Yes How Accessible: Accessible via walker Home Equipment: [place] - single point;Grab bars - tub/shower          Prior Functioning/Environment Prior Level of Function : Needs assist       Physical Assist : ADLs (physical)   ADLs (physical): IADLs;Dressing Mobility Comments: [place] ambulator without RW; would reportedly fatigue quickly if ambulating in community. ADLs Comments: Assisted for lower body dressing and upper body dressing. Assisted for IADL's.  OT Problem List: Decreased activity tolerance;Impaired balance (sitting and/or standing);Pain      OT Treatment/Interventions: Self-care/ADL training;Therapeutic exercise;DME and/or AE instruction;Patient/family education;Balance training    OT Goals(Current goals can be found in the care plan section) Acute Rehab OT Goals Patient Stated Goal: Reported she wants pt to be able to get around. OT Goal Formulation: With patient/family Time For Goal Achievement: 04/19/22 Potential to Achieve Goals: Good  OT Frequency: Min 2X/week    Co-evaluation PT/OT/SLP Co-Evaluation/Treatment: Yes Reason for Co-Treatment: To address functional/ADL transfers   OT  goals addressed during session: ADL's and self-care      AM-PAC OT "6 Clicks" Daily Activity     Outcome Measure Help from another person eating meals?: None Help from another person taking care of personal grooming?: A Little Help from another person toileting, which includes using toliet, bedpan, or urinal?: A Little Help from another person bathing (including washing, rinsing, drying)?: A Lot Help from another person to put on and taking off regular upper body clothing?: A Little Help from another person to put on and taking off regular lower body clothing?: A Lot 6 Click Score: 17   End of Session Equipment Utilized During Treatment: Rolling walker (2 wheels);Oxygen;Gait belt  Activity Tolerance: Patient tolerated treatment well Patient left: in bed;with call bell/phone within reach;with family/visitor present  OT Visit Diagnosis: Unsteadiness on feet (R26.81);Other abnormalities of gait and mobility (R26.89);Muscle weakness (generalized) (M62.81);History of falling (Z91.81);Pain Pain - Right/Left: Right Pain - part of body:  (flank)                Time: 5465-6812 OT Time Calculation (min): 28 min Charges:  OT General Charges $OT Visit: 1 Visit OT Evaluation $OT Eval Low Complexity: 1 Low  Lawrence Reynolds OT, MOT  Larey Seat 04/05/2022, 9:44 AM

## 2022-04-05 NOTE — Evaluation (Addendum)
Physical Therapy Evaluation Patient Details Name: Lawrence Reynolds MRN: [id number] DOB: 1941/07/07 Today's Date: 04/05/2022  History of Present Illness  Lawrence Reynolds is a 81 y.o. male with medical history significant of dementia, BPH, essential tremor, lives at home with his wife.  Patient is normally ambulatory at baseline.  Patient was reportedly in his usual state of health when he suffered a fall on the night of admission.  This was an unwitnessed fall, but family feels that he may have slid out of bed.  He landed on his right side.  He was brought to the hospital for evaluation where imaging showed several uncomplicated right-sided rib fractures.  CT imaging of his head and neck were unremarkable.  He was also noted to be mildly hypoxic on room air at 85%.  He was having significant pain and received pain medications.  Admission requested for observation for hypoxia and pain management.    Clinical Impression  Patient lying in bed with HOB elevated on therapists arrival.  He is agreeable to therapy evaluation. Wife is present at bedside.  Bed put into flat position to mimic bed at his home.  He needs mod to max assist for supine to sit to bring trunk upright; complains of pain and grimaces holding his right side.  Once sitting on the edge of the bed; needs only stand by assistance for safety.  On 2L of O2 and oxygen saturation 96-98% sitting. He does not use oxygen at home per he and his wife so we remove and it remains 94% and up sitting.  Sit to stand with min A and able to ambulate with RW and CGA x 60 ft.  His oxygen initially stays up but halfway through walk drops to 65%.  Once we return to the room his O2 returns to 90% as soon as he sits and rests.  Patient will benefit from continued skilled therapy interventions to address deficits and promote optimal function during the remainder of his hospital stay and at the next recommended venue of care.      Recommendations for follow up  therapy are one component of a multi-disciplinary discharge planning process, led by the attending physician.  Recommendations may be updated based on patient status, additional functional criteria and insurance authorization.  Follow Up Recommendations Skilled nursing-short term rehab (<3 hours/day) Can patient physically be transported by private vehicle: Yes    Assistance Recommended at Discharge Frequent or constant Supervision/Assistance  Patient can return home with the following  A little help with walking and/or transfers;A little help with bathing/dressing/bathroom;Help with stairs or ramp for entrance    Equipment Recommendations Rolling walker (2 wheels)  Recommendations for Other Services       Functional Status Assessment Patient has had a recent decline in their functional status and demonstrates the ability to make significant improvements in function in a reasonable and predictable amount of time.     Precautions / Restrictions Precautions Precautions: Fall Restrictions Weight Bearing Restrictions: No      Mobility  Bed Mobility Overal bed mobility: Needs Assistance Bed Mobility: Supine to Sit, Sit to Supine     Supine to sit: Mod assist, Max assist, HOB elevated Sit to supine: Mod assist, Max assist   General bed mobility comments: Pt struggle with increased pain and difficulty moving B LE and pulling to sit. Much assist needed. Patient Response: Cooperative  Transfers Overall transfer level: Needs assistance Equipment used: Rolling walker (2 wheels) Transfers: Sit to/from Stand, Bed to chair/wheelchair/BSC  Sit to Stand: Min guard, Min assist   Step pivot transfers: Min guard, Min assist       General transfer comment: Pt able to boost from bed with min A and ambulate in hall prior to return to bed using RW.    Ambulation/Gait Ambulation/Gait assistance: Min assist Gait Distance (Feet): 60 Feet Assistive device: Rolling walker (2 wheels) Gait  Pattern/deviations: Decreased step length - right, Decreased step length - left       General Gait Details: O2 dropped to 65% during walk; returned to 90% above on sitting  Stairs            Wheelchair Mobility    Modified Rankin (Stroke Patients Only)       Balance Overall balance assessment: Needs assistance Sitting-balance support: No upper extremity supported, Feet supported Sitting balance-Leahy Scale: Good Sitting balance - Comments: seated EOB   Standing balance support: Bilateral upper extremity supported, During functional activity, Reliant on assistive device for balance Standing balance-Leahy Scale: Poor Standing balance comment: poor to fair with RW                             Pertinent Vitals/Pain      Home Living Family/patient expects to be discharged to:: Private residence Living Arrangements: Spouse/significant other Available Help at Discharge: Family;Available 24 hours/day Type of Home: House Home Access: Stairs to enter Entrance Stairs-Rails:  (Pt has bar on L side going in.) Entrance Stairs-Number of Steps: 1 step and 1 threshold into house   Home Layout: One level Home Equipment: Cane - single point;Grab bars - tub/shower      Prior Function Prior Level of Function : Needs assist       Physical Assist : ADLs (physical)   ADLs (physical): IADLs;Dressing Mobility Comments: [place] ambulator without RW; would reportedly fatigue quickly if ambulating in community. ADLs Comments: Assisted for lower body dressing and upper body dressing. Assisted for IADL's.     Hand Dominance   Dominant Hand: Right    Extremity/Trunk Assessment   Upper Extremity Assessment Upper Extremity Assessment: Defer to OT evaluation    Lower Extremity Assessment Lower Extremity Assessment: Generalized weakness    Cervical / Trunk Assessment Cervical / Trunk Assessment: Normal  Communication   Communication: No difficulties  Cognition  Arousal/Alertness: Awake/alert Behavior During Therapy: WFL for tasks assessed/performed Overall Cognitive Status: Within Functional Limits for tasks assessed                                          General Comments      Exercises     Assessment/Plan    PT Assessment Patient needs continued PT services  PT Problem List Decreased strength;Decreased range of motion;Decreased activity tolerance;Decreased safety awareness;Decreased balance;Pain;Decreased mobility;Cardiopulmonary status limiting activity       PT Treatment Interventions DME instruction;Balance training;Gait training;Neuromuscular re-education;Functional mobility training;Patient/family education;Therapeutic activities;Therapeutic exercise    PT Goals (Current goals can be found in the Care Plan section)  Acute Rehab PT Goals Patient Stated Goal: return home PT Goal Formulation: With patient/family Time For Goal Achievement: 04/19/22 Potential to Achieve Goals: Good    Frequency Min 2X/week     Co-evaluation PT/OT/SLP Co-Evaluation/Treatment: Yes Reason for Co-Treatment: Complexity of the patient's impairments (multi-system involvement);To address functional/ADL transfers;Necessary to address cognition/behavior during functional activity PT goals addressed during session: Mobility/safety  with mobility OT goals addressed during session: ADL's and self-care       AM-PAC PT "6 Clicks" Mobility  Outcome Measure Help needed turning from your back to your side while in a flat bed without using bedrails?: A Lot Help needed moving from lying on your back to sitting on the side of a flat bed without using bedrails?: A Lot Help needed moving to and from a bed to a chair (including a wheelchair)?: A Little Help needed standing up from a chair using your arms (e.g., wheelchair or bedside chair)?: A Little Help needed to walk in hospital room?: A Little Help needed climbing 3-5 steps with a railing? : A  Lot 6 Click Score: 15    End of Session   Activity Tolerance: Patient limited by fatigue;Other (comment) (O2 desaturation) Patient left: in bed;with family/visitor present;with call bell/phone within reach;with bed alarm set   PT Visit Diagnosis: Unsteadiness on feet (R26.81);Other abnormalities of gait and mobility (R26.89);History of falling (Z91.81);Muscle weakness (generalized) (M62.81);Pain Pain - Right/Left: Right Pain - part of body:  (ribs)    Time: 3887-1959 PT Time Calculation (min) (ACUTE ONLY): 26 min   Charges:   PT Evaluation $PT Eval Low Complexity: 1 Low PT Treatments $Therapeutic Activity: 8-22 mins        11:36 AM, 04/05/22 Malyia Moro Small Trany Chernick MPT [place] [facility] [place] [phone] [phone]

## 2022-04-05 NOTE — TOC Transition Note (Signed)
Transition of Care [facility]) - CM/SW Discharge Note   Patient Details  Name: Lawrence Reynolds MRN: [id number] Date of Birth: 08-Mar-1941  [facility]) CM/SW Contact:  Boneta Lucks, RN Phone Number: 04/05/2022, 12:59 PM   Clinical Narrative:   PT/OT is recommending SNF.  Wife and daughter at the bedside with MD.  Patient want to go home. TOC spoke with daughter. Patient needs Home oxygen and walker. MD is ordering,Zach with [employer] is on an ETA for delivery. Patient needs HHPT/OT and Aide. Referral sent to [facility] with Alvis Lemmings and added to AVS.   Final next level of care: [place] Barriers to Discharge: Barriers Resolved   Patient Goals and CMS Choice Patient states their goals for this hospitalization and ongoing recovery are:: to go home. CMS Medicare.gov Compare Post Acute Care list provided to:: Patient Represenative (must comment) Choice offered to / list presented to : Adult Children  Discharge Placement              Patient to be transferred to facility by: wife Name of family member notified: Daughter Patient and family notified of of transfer: 04/05/22  Discharge Plan and Services               DME Arranged: Oxygen, Walker DME Agency: [employer] Date DME Agency Contacted: 04/05/22 Time DME Agency Contacted: [phone] Representative spoke with at DME Agency: Thedore Mins HH Arranged: PT, OT, Nurse's Aide [place] Agency: [place] Date [place]: 04/05/22 Time [place]: 0712 Representative spoke with at [place]: Georgina Snell

## 2022-04-05 NOTE — Care Management Obs Status (Signed)
[place] NOTIFICATION   Patient Details  Name: Lawrence Reynolds MRN: [id number] Date of Birth: 03-Aug-1940   Medicare Observation Status Notification Given:  Yes    [name] 04/05/2022, 12:13 PM

## 2022-04-05 NOTE — Plan of Care (Signed)
  Problem: Acute Rehab OT Goals (only OT should resolve) Goal: Pt. Will Perform Grooming Flowsheets (Taken 04/05/2022 0946) Pt Will Perform Grooming:  with modified independence  standing Goal: Pt. Will Perform Upper Body Dressing Flowsheets (Taken 04/05/2022 0946) Pt Will Perform Upper Body Dressing:  with modified independence  sitting Goal: Pt. Will Perform Lower Body Dressing Flowsheets (Taken 04/05/2022 0946) Pt Will Perform Lower Body Dressing:  with min assist  sitting/lateral leans  with adaptive equipment Goal: Pt. Will Transfer To Toilet Flowsheets (Taken 04/05/2022 0946) Pt Will Transfer to Toilet:  with modified independence  ambulating Goal: Pt. Will Perform Toileting-Clothing Manipulation Flowsheets (Taken 04/05/2022 0946) Pt Will Perform Toileting - Clothing Manipulation and hygiene:  with modified independence  sit to/from stand  sitting/lateral leans  Anel Creighton OT, MOT

## 2022-04-07 ENCOUNTER — Emergency Department (HOSPITAL_COMMUNITY): Payer: Medicare PPO

## 2022-04-07 ENCOUNTER — Encounter (HOSPITAL_COMMUNITY): Payer: Self-pay

## 2022-04-07 ENCOUNTER — Emergency Department (HOSPITAL_COMMUNITY)
Admission: EM | Admit: 2022-04-07 | Discharge: 2022-04-07 | Disposition: A | Discharge status: Home / Self Care | Payer: Medicare PPO | Attending: Emergency Medicine | Admitting: Emergency Medicine

## 2022-04-07 ENCOUNTER — Other Ambulatory Visit: Payer: Self-pay

## 2022-04-07 DIAGNOSIS — S2241XD Multiple fractures of ribs, right side, subsequent encounter for fracture with routine healing: Secondary | ICD-10-CM | POA: Insufficient documentation

## 2022-04-07 DIAGNOSIS — F0390 Unspecified dementia without behavioral disturbance: Secondary | ICD-10-CM | POA: Diagnosis not present

## 2022-04-07 DIAGNOSIS — S299XXD Unspecified injury of thorax, subsequent encounter: Secondary | ICD-10-CM | POA: Diagnosis present

## 2022-04-07 DIAGNOSIS — W06XXXD Fall from bed, subsequent encounter: Secondary | ICD-10-CM | POA: Insufficient documentation

## 2022-04-07 DIAGNOSIS — R0789 Other chest pain: Secondary | ICD-10-CM

## 2022-04-07 DIAGNOSIS — D72829 Elevated white blood cell count, unspecified: Secondary | ICD-10-CM | POA: Insufficient documentation

## 2022-04-07 DIAGNOSIS — R079 Chest pain, unspecified: Secondary | ICD-10-CM | POA: Diagnosis not present

## 2022-04-07 HISTORY — DX: Unspecified dementia, unspecified severity, without behavioral disturbance, psychotic disturbance, mood disturbance, and anxiety: F03.90

## 2022-04-07 LAB — CBC
HCT: 44.6 % (ref 39.0–52.0)
Hemoglobin: 14.6 g/dL (ref 13.0–17.0)
MCH: 31.2 pg (ref 26.0–34.0)
MCHC: 32.7 g/dL (ref 30.0–36.0)
MCV: 95.3 fL (ref 80.0–100.0)
Platelets: 222 10*3/uL (ref 150–400)
RBC: 4.68 MIL/uL (ref 4.22–5.81)
RDW: 13.6 % (ref 11.5–15.5)
WBC: 12 10*3/uL — ABNORMAL HIGH (ref 4.0–10.5)
nRBC: 0 % (ref 0.0–0.2)

## 2022-04-07 LAB — BASIC METABOLIC PANEL
Anion gap: 7 (ref 5–15)
BUN: 21 mg/dL (ref 8–23)
CO2: 27 mmol/L (ref 22–32)
Calcium: 8.7 mg/dL — ABNORMAL LOW (ref 8.9–10.3)
Chloride: 104 mmol/L (ref 98–111)
Creatinine, Ser: 0.9 mg/dL (ref 0.61–1.24)
GFR, Estimated: >60 mL/min (ref >60)
Glucose, Bld: 118 mg/dL — ABNORMAL HIGH (ref 70–99)
Potassium: 3.7 mmol/L (ref 3.5–5.1)
Sodium: 138 mmol/L (ref 135–145)

## 2022-04-07 LAB — TROPONIN I (HIGH SENSITIVITY): Troponin I (High Sensitivity): 4 ng/L (ref <18)

## 2022-04-07 MED ORDER — HYDROCODONE-ACETAMINOPHEN 5-325 MG PO TABS
1.0000 | ORAL_TABLET | Freq: Once | ORAL | Status: AC
  Administered 2022-04-07: 1 via ORAL
  Filled 2022-04-07: qty 1

## 2022-04-07 NOTE — ED Triage Notes (Signed)
Pt to er, pt states that he is here for chest pain that he has had for the past two days, family with pt, states that he has a hx of dementia.  Family states that pt fell out of the bed Sunday and broke 4 ribs.

## 2022-04-07 NOTE — Progress Notes (Signed)
Gave patient Chiropodist.  Explained usage to patient and had patient exhibit back to me.  Patient was able to do 1250 ml X10 with good effort.  Left IS with wife who is at bedside.

## 2022-04-07 NOTE — Discharge Instructions (Addendum)
Your work-up today was reassuring.  Your heart enzyme, EKG did not show any evidence of heart attack.  X-ray did not show any evidence of pneumonia or other concerning findings.  Given the recently have had multiple rib fractures it is imperative that you use the incentive spirometer to continue expanding your lungs to minimize the risk of pneumonia.  You were given this today during your emergency room visit.  Take the pain medication as you were prescribed to help with pain.  The pain that you presented with is most likely due to the rib fractures given they are also on the right side around where you are having the pain.  For any concerning symptoms return to the emergency room otherwise please follow-up with your primary care provider for hospital follow-up appointment.

## 2022-04-07 NOTE — ED Provider Notes (Signed)
[facility] EMERGENCY DEPARTMENT Provider Note   CSN: [id number] Arrival date & time: 04/07/22  1928     History  Chief Complaint  Patient presents with   Chest Pain    Lawrence Reynolds is a 81 y.o. male.  81 year old male with past medical history significant for dementia who was recently discharged from the hospital following 4 rib fractures.  Patient was discharged on 9/4 after good pain control.  Patient's wife who is at bedside reports that since yesterday patient has been complaining of chest pain.  She asked him yesterday if he wanted to come into the emergency room and he said no.  Today he agreed to come into the emergency room after supper when he complained of chest pain.  Pain is right-sided.  Rib fractures are also on the right side.  Denies any shortness of breath.  He did have some exertional dyspnea so he was set up with home oxygen.  He has not been wearing this at home per his wife.  He has primarily been taking aspirin as opposed to his prescribed pain medication at home.  The history is provided by the patient. No language interpreter was used.       Home Medications Prior to Admission medications   Medication Sig Start Date End Date Taking? Authorizing Provider  butalbital-acetaminophen-caffeine (FIORICET, ESGIC) 50-325-40 MG tablet Take 1 tablet by mouth every 6 (six) hours as needed for headache. 12/24/17   Dhungel, Flonnie Overman, MD  cholecalciferol (VITAMIN D) 1000 units tablet Take 2,000 Units by mouth daily.    [provider]  donepezil (ARICEPT) 10 MG tablet Take 1 tablet by mouth daily. 11/21/17   [provider]  EPINEPHrine (EPIPEN) 0.3 mg/0.3 mL SOAJ injection Inject 1 mL (1 mg total) into the muscle once. 04/06/13   Doree Albee, MD  gabapentin (NEURONTIN) 300 MG capsule Take 300 mg by mouth 2 (two) times daily.  01/13/15   [provider]  HYDROcodone-acetaminophen (NORCO/VICODIN) 5-325 MG tablet Take 1-2 tablets by mouth every 4  (four) hours as needed for moderate pain. 04/05/22   Kathie Dike, MD  primidone (MYSOLINE) 50 MG tablet Take 100 mg by mouth 2 (two) times daily.     [provider]  Tamsulosin HCl (FLOMAX) 0.4 MG CAPS Take 0.4 mg by mouth every evening.     [provider]  vitamin B-12 (CYANOCOBALAMIN) 1000 MCG tablet Take 1 tablet (1,000 mcg total) by mouth daily. 12/24/17   Dhungel, Nishant, MD  VITAMIN E PO Take 1 tablet by mouth daily.    [provider]      Allergies    Patient has no known allergies.    Review of Systems   Review of Systems  Unable to perform ROS: Dementia  Respiratory:  Negative for cough and shortness of breath.   Cardiovascular:  Positive for chest pain. Negative for palpitations.  Genitourinary:  Negative for difficulty urinating.  Neurological:  Negative for light-headedness.  All other systems reviewed and are negative.   Physical Exam Updated Vital Signs BP (!) 106/58   Pulse 68   Temp (!) 97.5 F (36.4 C) (Oral)   Resp 15   SpO2 96%  Physical Exam Vitals and nursing note reviewed.  Constitutional:      General: He is not in acute distress.    Appearance: Normal appearance. He is not ill-appearing.  HENT:     Head: Normocephalic and atraumatic.     Nose: Nose normal.  Eyes:  General: No scleral icterus.    Extraocular Movements: Extraocular movements intact.     Conjunctiva/sclera: Conjunctivae normal.  Cardiovascular:     Rate and Rhythm: Normal rate and regular rhythm.     Pulses: Normal pulses.     Comments: Right-sided chest wall pain. Pulmonary:     Effort: Pulmonary effort is normal. No respiratory distress.     Breath sounds: Normal breath sounds. No wheezing or rales.  Abdominal:     General: There is no distension.     Palpations: Abdomen is soft.     Tenderness: There is no abdominal tenderness.  Musculoskeletal:        General: Normal range of motion.     Cervical back: Normal range of motion.     Right  lower leg: No edema.     Left lower leg: No edema.  Skin:    General: Skin is warm and dry.  Neurological:     General: No focal deficit present.     Mental Status: He is alert. Mental status is at baseline.     ED Results / Procedures / Treatments   Labs (all labs ordered are listed, but only abnormal results are displayed) Labs Reviewed  BASIC METABOLIC PANEL - Abnormal; Notable for the following components:      Result Value   Glucose, Bld 118 (*)    Calcium 8.7 (*)    All other components within normal limits  CBC - Abnormal; Notable for the following components:   WBC 12.0 (*)    All other components within normal limits  TROPONIN I (HIGH SENSITIVITY)  TROPONIN I (HIGH SENSITIVITY)    EKG EKG Interpretation  Date/Time:  Wednesday April 07 2022 19:37:12 EDT Ventricular Rate:  70 PR Interval:  192 QRS Duration: 78 QT Interval:  352 QTC Calculation: 380 R Axis:   -14 Text Interpretation: Sinus rhythm with Premature atrial complexes Low voltage QRS Possible Anterolateral infarct , age undetermined Abnormal ECG When compared with ECG of 04-Apr-2022 05:30, PREVIOUS ECG IS PRESENT since last tracing no significant change Confirmed by Noemi Chapel [id number]) on 04/07/2022 8:02:46 PM  Radiology DG Chest 1 View  Result Date: 04/07/2022 CLINICAL DATA:  Chest pain. EXAM: CHEST  1 VIEW COMPARISON:  Chest radiograph dated 04/04/2022. FINDINGS: No focal consolidation, pleural effusion or pneumothorax. Stable cardiac silhouette. Atherosclerotic calcification of the aorta. No acute osseous pathology. IMPRESSION: No acute cardiopulmonary process. Electronically Signed   By: Anner Crete M.D.   On: 04/07/2022 20:28    Procedures Procedures    Medications Ordered in ED Medications  HYDROcodone-acetaminophen (NORCO/VICODIN) 5-325 MG per tablet 1 tablet (has no administration in time range)    ED Course/ Medical Decision Making/ A&P                           Medical Decision  Making Amount and/or Complexity of Data Reviewed Labs: ordered. Radiology: ordered.  Risk Prescription drug management.   Medical Decision Making / ED Course   This patient presents to the ED for concern of right-sided chest wall pain, this involves an extensive number of treatment options, and is a complaint that carries with it a high risk of complications and morbidity.  The differential diagnosis includes ACS, pneumonia, pain from recent rib fractures  MDM: 80 year old male presents today with wife and daughter for evaluation of right-sided chest wall pain.  Patient was recently discharged on 9/4 following for right-sided rib fractures.  These  are anterior lateral fractures of the second, third, fourth rib, and posterolateral fracture of the fifth rib.  Patient has not been taking his narcotic pain medication.  Patient has history of dementia and is not able to provide significant history.  Wife assists with history.  Patient without significant past cardiac history.  Work-up today shows mild leukocytosis, no anemia.  BMP shows glucose of 118 otherwise without acute findings.  Initial troponin of 4.  Chest x-ray without acute cardiopulmonary process.  Doubt ACS.  Pain most likely secondary to recent fractures.  He does not have an incentive spirometer at home.  1 was provided during today's emergency room visit.  Patient discussed with attending Dr. Sabra Heck.  Patient is appropriate for discharge.  Discharged in stable condition.  Return precautions discussed.   Additional history obtained: -Additional history obtained from recent admission note.  Wife and daughter at bedside. -External records from outside source obtained and reviewed including: Chart review including previous notes, labs, imaging, consultation notes   Lab Tests: -I ordered, reviewed, and interpreted labs.   The pertinent results include:   Labs Reviewed  BASIC METABOLIC PANEL - Abnormal; Notable for the following  components:      Result Value   Glucose, Bld 118 (*)    Calcium 8.7 (*)    All other components within normal limits  CBC - Abnormal; Notable for the following components:   WBC 12.0 (*)    All other components within normal limits  TROPONIN I (HIGH SENSITIVITY)      EKG  EKG Interpretation  Date/Time:  Wednesday April 07 2022 19:37:12 EDT Ventricular Rate:  70 PR Interval:  192 QRS Duration: 78 QT Interval:  352 QTC Calculation: 380 R Axis:   -14 Text Interpretation: Sinus rhythm with Premature atrial complexes Low voltage QRS Possible Anterolateral infarct , age undetermined Abnormal ECG When compared with ECG of 04-Apr-2022 05:30, PREVIOUS ECG IS PRESENT since last tracing no significant change Confirmed by Noemi Chapel [phone]) on 04/07/2022 8:02:46 PM         Imaging Studies ordered: I ordered imaging studies including chest x-ray.  No evidence of pneumonia or pneumothorax I independently visualized and interpreted imaging. I agree with the radiologist interpretation   Medicines ordered and prescription drug management: Meds ordered this encounter  Medications   HYDROcodone-acetaminophen (NORCO/VICODIN) 5-325 MG per tablet 1 tablet    -I have reviewed the patients home medicines and have made adjustments as needed  Cardiac Monitoring: The patient was maintained on a cardiac monitor.  I personally viewed and interpreted the cardiac monitored which showed an underlying rhythm of: Normal sinus rhythm   Reevaluation: After the interventions noted above, I reevaluated the patient and found that they have :improved  Co morbidities that complicate the patient evaluation  Past Medical History:  Diagnosis Date   BPH (benign prostatic hypertrophy)    Dementia ([place])    Diverticulitis    Headache(784.0)       Dispostion: Patient is appropriate for discharge.  Discharged in stable condition.  Patient does not require admission given cardiac work-up is normal.   He is without acute distress.  No evidence of pneumonia on chest x-ray.  Satting mid 90s on his supplemental O2.  Has O2 at home.  Final Clinical Impression(s) / ED Diagnoses Final diagnoses:  Chest wall pain  Closed fracture of multiple ribs of right side with routine healing, subsequent encounter    Rx / DC Orders ED Discharge Orders  None         Evlyn Courier, PA-C 04/07/22 2139    Noemi Chapel, MD 04/17/22 2154

## 2022-04-07 NOTE — ED Provider Notes (Signed)
Medical screening examination/treatment/procedure(s) were conducted as a shared visit with non-physician practitioner(s) and myself.  I personally evaluated the patient during the encounter.  Clinical Impression:   Final diagnoses:  None    Patient is an 81 year old male recently treated for rib fractures, history of dementia, evidently he had fallen out of the bed on Sunday and broke 4 ribs.  He does not have an incentive spirometer at home, his oxygen is 95% on his baseline O2 requirement, his EKG is essentially unchanged and lab work is unremarkable including troponin.  Stable for discharge with incentive spirometer, pain is right side of chest significant and correlating with rib fractures     Noemi Chapel, MD 04/17/22 2153

## 2022-04-12 DIAGNOSIS — Z87891 Personal history of nicotine dependence: Secondary | ICD-10-CM | POA: Diagnosis not present

## 2022-04-12 DIAGNOSIS — R0781 Pleurodynia: Secondary | ICD-10-CM | POA: Diagnosis not present

## 2022-04-12 DIAGNOSIS — Z6841 Body Mass Index (BMI) 40.0 and over, adult: Secondary | ICD-10-CM | POA: Diagnosis not present

## 2022-04-12 DIAGNOSIS — F0280 Dementia in other diseases classified elsewhere without behavioral disturbance: Secondary | ICD-10-CM | POA: Diagnosis not present

## 2022-04-12 DIAGNOSIS — Z23 Encounter for immunization: Secondary | ICD-10-CM | POA: Diagnosis not present

## 2022-04-12 DIAGNOSIS — Z299 Encounter for prophylactic measures, unspecified: Secondary | ICD-10-CM | POA: Diagnosis not present

## 2022-04-12 DIAGNOSIS — G309 Alzheimer's disease, unspecified: Secondary | ICD-10-CM | POA: Diagnosis not present

## 2022-04-12 DIAGNOSIS — J9611 Chronic respiratory failure with hypoxia: Secondary | ICD-10-CM | POA: Diagnosis not present

## 2022-04-19 DIAGNOSIS — F0390 Unspecified dementia without behavioral disturbance: Secondary | ICD-10-CM | POA: Diagnosis not present

## 2022-04-19 DIAGNOSIS — I2510 Atherosclerotic heart disease of native coronary artery without angina pectoris: Secondary | ICD-10-CM | POA: Diagnosis not present

## 2022-04-19 DIAGNOSIS — S2241XD Multiple fractures of ribs, right side, subsequent encounter for fracture with routine healing: Secondary | ICD-10-CM | POA: Diagnosis not present

## 2022-04-19 DIAGNOSIS — G250 Essential tremor: Secondary | ICD-10-CM | POA: Diagnosis not present

## 2022-05-05 DIAGNOSIS — R0902 Hypoxemia: Secondary | ICD-10-CM | POA: Diagnosis not present

## 2022-06-05 DIAGNOSIS — R0902 Hypoxemia: Secondary | ICD-10-CM | POA: Diagnosis not present

## 2022-06-23 DIAGNOSIS — Z299 Encounter for prophylactic measures, unspecified: Secondary | ICD-10-CM | POA: Diagnosis not present

## 2022-06-23 DIAGNOSIS — Z6841 Body Mass Index (BMI) 40.0 and over, adult: Secondary | ICD-10-CM | POA: Diagnosis not present

## 2022-06-23 DIAGNOSIS — Z0000 Encounter for general adult medical examination without abnormal findings: Secondary | ICD-10-CM | POA: Diagnosis not present

## 2022-06-23 DIAGNOSIS — I700 Atherosclerosis of aorta: Secondary | ICD-10-CM | POA: Diagnosis not present

## 2022-06-23 DIAGNOSIS — G4710 Hypersomnia, unspecified: Secondary | ICD-10-CM | POA: Diagnosis not present

## 2022-07-05 DIAGNOSIS — R0902 Hypoxemia: Secondary | ICD-10-CM | POA: Diagnosis not present

## 2022-08-05 DIAGNOSIS — R0902 Hypoxemia: Secondary | ICD-10-CM | POA: Diagnosis not present

## 2022-09-01 DIAGNOSIS — Z0000 Encounter for general adult medical examination without abnormal findings: Secondary | ICD-10-CM | POA: Diagnosis not present

## 2022-09-01 DIAGNOSIS — Z299 Encounter for prophylactic measures, unspecified: Secondary | ICD-10-CM | POA: Diagnosis not present

## 2022-09-01 DIAGNOSIS — Z1331 Encounter for screening for depression: Secondary | ICD-10-CM | POA: Diagnosis not present

## 2022-09-01 DIAGNOSIS — I700 Atherosclerosis of aorta: Secondary | ICD-10-CM | POA: Diagnosis not present

## 2022-09-01 DIAGNOSIS — Z1339 Encounter for screening examination for other mental health and behavioral disorders: Secondary | ICD-10-CM | POA: Diagnosis not present

## 2022-09-01 DIAGNOSIS — Z6841 Body Mass Index (BMI) 40.0 and over, adult: Secondary | ICD-10-CM | POA: Diagnosis not present

## 2022-09-01 DIAGNOSIS — Z7189 Other specified counseling: Secondary | ICD-10-CM | POA: Diagnosis not present

## 2022-09-02 DIAGNOSIS — E7800 Pure hypercholesterolemia, unspecified: Secondary | ICD-10-CM | POA: Diagnosis not present

## 2022-09-02 DIAGNOSIS — R5383 Other fatigue: Secondary | ICD-10-CM | POA: Diagnosis not present

## 2022-09-02 DIAGNOSIS — Z125 Encounter for screening for malignant neoplasm of prostate: Secondary | ICD-10-CM | POA: Diagnosis not present

## 2022-09-02 DIAGNOSIS — Z79899 Other long term (current) drug therapy: Secondary | ICD-10-CM | POA: Diagnosis not present

## 2022-09-05 DIAGNOSIS — R0902 Hypoxemia: Secondary | ICD-10-CM | POA: Diagnosis not present

## 2023-01-04 DIAGNOSIS — C44311 Basal cell carcinoma of skin of nose: Secondary | ICD-10-CM | POA: Diagnosis not present

## 2023-01-04 DIAGNOSIS — C44319 Basal cell carcinoma of skin of other parts of face: Secondary | ICD-10-CM | POA: Diagnosis not present

## 2023-02-14 DIAGNOSIS — F0280 Dementia in other diseases classified elsewhere without behavioral disturbance: Secondary | ICD-10-CM | POA: Diagnosis not present

## 2023-02-14 DIAGNOSIS — Z0000 Encounter for general adult medical examination without abnormal findings: Secondary | ICD-10-CM | POA: Diagnosis not present

## 2023-02-14 DIAGNOSIS — G309 Alzheimer's disease, unspecified: Secondary | ICD-10-CM | POA: Diagnosis not present

## 2023-02-14 DIAGNOSIS — C44310 Basal cell carcinoma of skin of unspecified parts of face: Secondary | ICD-10-CM | POA: Diagnosis not present

## 2023-02-14 DIAGNOSIS — Z299 Encounter for prophylactic measures, unspecified: Secondary | ICD-10-CM | POA: Diagnosis not present

## 2023-07-14 DIAGNOSIS — F0280 Dementia in other diseases classified elsewhere without behavioral disturbance: Secondary | ICD-10-CM | POA: Diagnosis not present

## 2023-07-14 DIAGNOSIS — J069 Acute upper respiratory infection, unspecified: Secondary | ICD-10-CM | POA: Diagnosis not present

## 2023-07-14 DIAGNOSIS — I700 Atherosclerosis of aorta: Secondary | ICD-10-CM | POA: Diagnosis not present

## 2023-07-14 DIAGNOSIS — Z299 Encounter for prophylactic measures, unspecified: Secondary | ICD-10-CM | POA: Diagnosis not present

## 2023-07-14 DIAGNOSIS — G309 Alzheimer's disease, unspecified: Secondary | ICD-10-CM | POA: Diagnosis not present

## 2023-09-15 DIAGNOSIS — B356 Tinea cruris: Secondary | ICD-10-CM | POA: Diagnosis not present

## 2023-09-15 DIAGNOSIS — C44310 Basal cell carcinoma of skin of unspecified parts of face: Secondary | ICD-10-CM | POA: Diagnosis not present

## 2023-09-15 DIAGNOSIS — Z299 Encounter for prophylactic measures, unspecified: Secondary | ICD-10-CM | POA: Diagnosis not present

## 2023-09-15 DIAGNOSIS — F0280 Dementia in other diseases classified elsewhere without behavioral disturbance: Secondary | ICD-10-CM | POA: Diagnosis not present

## 2023-10-09 ENCOUNTER — Emergency Department (HOSPITAL_COMMUNITY)
Admission: EM | Admit: 2023-10-09 | Discharge: 2023-10-09 | Disposition: A | Discharge status: Home / Self Care | Attending: Emergency Medicine | Admitting: Emergency Medicine

## 2023-10-09 ENCOUNTER — Encounter (HOSPITAL_COMMUNITY): Payer: Self-pay

## 2023-10-09 ENCOUNTER — Other Ambulatory Visit: Payer: Self-pay

## 2023-10-09 ENCOUNTER — Emergency Department (HOSPITAL_COMMUNITY)

## 2023-10-09 DIAGNOSIS — G319 Degenerative disease of nervous system, unspecified: Secondary | ICD-10-CM | POA: Diagnosis not present

## 2023-10-09 DIAGNOSIS — G9608 Other cranial cerebrospinal fluid leak: Secondary | ICD-10-CM | POA: Diagnosis not present

## 2023-10-09 DIAGNOSIS — I771 Stricture of artery: Secondary | ICD-10-CM | POA: Diagnosis not present

## 2023-10-09 DIAGNOSIS — D72829 Elevated white blood cell count, unspecified: Secondary | ICD-10-CM | POA: Insufficient documentation

## 2023-10-09 DIAGNOSIS — G928 Other toxic encephalopathy: Secondary | ICD-10-CM | POA: Diagnosis not present

## 2023-10-09 DIAGNOSIS — R42 Dizziness and giddiness: Secondary | ICD-10-CM | POA: Diagnosis not present

## 2023-10-09 DIAGNOSIS — Z79899 Other long term (current) drug therapy: Secondary | ICD-10-CM | POA: Diagnosis not present

## 2023-10-09 DIAGNOSIS — R262 Difficulty in walking, not elsewhere classified: Secondary | ICD-10-CM | POA: Diagnosis not present

## 2023-10-09 DIAGNOSIS — I4891 Unspecified atrial fibrillation: Secondary | ICD-10-CM | POA: Diagnosis not present

## 2023-10-09 DIAGNOSIS — F0390 Unspecified dementia without behavioral disturbance: Secondary | ICD-10-CM | POA: Insufficient documentation

## 2023-10-09 DIAGNOSIS — R059 Cough, unspecified: Secondary | ICD-10-CM | POA: Diagnosis not present

## 2023-10-09 DIAGNOSIS — R2681 Unsteadiness on feet: Secondary | ICD-10-CM | POA: Diagnosis present

## 2023-10-09 DIAGNOSIS — H538 Other visual disturbances: Secondary | ICD-10-CM | POA: Diagnosis not present

## 2023-10-09 DIAGNOSIS — R9431 Abnormal electrocardiogram [ECG] [EKG]: Secondary | ICD-10-CM | POA: Diagnosis not present

## 2023-10-09 DIAGNOSIS — R9389 Abnormal findings on diagnostic imaging of other specified body structures: Secondary | ICD-10-CM | POA: Diagnosis not present

## 2023-10-09 LAB — COMPREHENSIVE METABOLIC PANEL
ALT: 19 U/L (ref 0–44)
AST: 30 U/L (ref 15–41)
Albumin: 3.9 g/dL (ref 3.5–5.0)
Alkaline Phosphatase: 70 U/L (ref 38–126)
Anion gap: 5 (ref 5–15)
BUN: 22 mg/dL (ref 8–23)
CO2: 28 mmol/L (ref 22–32)
Calcium: 8.7 mg/dL — ABNORMAL LOW (ref 8.9–10.3)
Chloride: 98 mmol/L (ref 98–111)
Creatinine, Ser: 1.1 mg/dL (ref 0.61–1.24)
GFR, Estimated: >60 mL/min (ref >60)
Glucose, Bld: 96 mg/dL (ref 70–99)
Potassium: 4.8 mmol/L (ref 3.5–5.1)
Sodium: 131 mmol/L — ABNORMAL LOW (ref 135–145)
Total Bilirubin: 0.5 mg/dL (ref 0.0–1.2)
Total Protein: 7.5 g/dL (ref 6.5–8.1)

## 2023-10-09 LAB — DIFFERENTIAL
Abs Immature Granulocytes: 0.11 10*3/uL — ABNORMAL HIGH (ref 0.00–0.07)
Basophils Absolute: 0.1 10*3/uL (ref 0.0–0.1)
Basophils Relative: 1 %
Eosinophils Absolute: 0.4 10*3/uL (ref 0.0–0.5)
Eosinophils Relative: 3 %
Immature Granulocytes: 1 %
Lymphocytes Relative: 27 %
Lymphs Abs: 3.7 10*3/uL (ref 0.7–4.0)
Monocytes Absolute: 1.1 10*3/uL — ABNORMAL HIGH (ref 0.1–1.0)
Monocytes Relative: 8 %
Neutro Abs: 8.1 10*3/uL — ABNORMAL HIGH (ref 1.7–7.7)
Neutrophils Relative %: 60 %

## 2023-10-09 LAB — URINALYSIS, ROUTINE W REFLEX MICROSCOPIC
Bilirubin Urine: NEGATIVE
Glucose, UA: NEGATIVE mg/dL
Hgb urine dipstick: NEGATIVE
Ketones, ur: NEGATIVE mg/dL
Leukocytes,Ua: NEGATIVE
Nitrite: NEGATIVE
Protein, ur: NEGATIVE mg/dL
Specific Gravity, Urine: 1.038 — ABNORMAL HIGH (ref 1.005–1.030)
pH: 6 (ref 5.0–8.0)

## 2023-10-09 LAB — APTT: aPTT: 31 s (ref 24–36)

## 2023-10-09 LAB — CBC
HCT: 48 % (ref 39.0–52.0)
Hemoglobin: 15.3 g/dL (ref 13.0–17.0)
MCH: 30.4 pg (ref 26.0–34.0)
MCHC: 31.9 g/dL (ref 30.0–36.0)
MCV: 95.4 fL (ref 80.0–100.0)
Platelets: 233 10*3/uL (ref 150–400)
RBC: 5.03 MIL/uL (ref 4.22–5.81)
RDW: 13.5 % (ref 11.5–15.5)
WBC: 13.6 10*3/uL — ABNORMAL HIGH (ref 4.0–10.5)
nRBC: 0 % (ref 0.0–0.2)

## 2023-10-09 LAB — I-STAT CHEM 8, ED
BUN: 27 mg/dL — ABNORMAL HIGH (ref 8–23)
Calcium, Ion: 1.05 mmol/L — ABNORMAL LOW (ref 1.15–1.40)
Chloride: 99 mmol/L (ref 98–111)
Creatinine, Ser: 1.2 mg/dL (ref 0.61–1.24)
Glucose, Bld: 99 mg/dL (ref 70–99)
HCT: 47 % (ref 39.0–52.0)
Hemoglobin: 16 g/dL (ref 13.0–17.0)
Potassium: 4.6 mmol/L (ref 3.5–5.1)
Sodium: 135 mmol/L (ref 135–145)
TCO2: 31 mmol/L (ref 22–32)

## 2023-10-09 LAB — RAPID URINE DRUG SCREEN, HOSP PERFORMED
Amphetamines: NOT DETECTED
Barbiturates: POSITIVE — AB
Benzodiazepines: NOT DETECTED
Cocaine: NOT DETECTED
Opiates: NOT DETECTED
Tetrahydrocannabinol: NOT DETECTED

## 2023-10-09 LAB — PROTIME-INR
INR: 1 (ref 0.8–1.2)
Prothrombin Time: 13.1 s (ref 11.4–15.2)

## 2023-10-09 LAB — RESP PANEL BY RT-PCR (RSV, FLU A&B, COVID)  RVPGX2
Influenza A by PCR: NEGATIVE
Influenza B by PCR: NEGATIVE
Resp Syncytial Virus by PCR: NEGATIVE
SARS Coronavirus 2 by RT PCR: NEGATIVE

## 2023-10-09 LAB — ETHANOL: Alcohol, Ethyl (B): <10 mg/dL (ref <10)

## 2023-10-09 LAB — CBG MONITORING, ED: Glucose-Capillary: 86 mg/dL (ref 70–99)

## 2023-10-09 MED ORDER — SODIUM CHLORIDE 0.9 % IV BOLUS
500.0000 mL | Freq: Once | INTRAVENOUS | Status: AC
  Administered 2023-10-09: 500 mL via INTRAVENOUS

## 2023-10-09 MED ORDER — IOHEXOL 350 MG/ML SOLN
75.0000 mL | Freq: Once | INTRAVENOUS | Status: AC | PRN
  Administered 2023-10-09: 75 mL via INTRAVENOUS

## 2023-10-09 NOTE — Discharge Instructions (Addendum)
 All of your test were reassuring, you will need to call Dr. Clelia Croft to be seen in the next couple of days for a follow-up but come back to the ER for any worsening symptoms

## 2023-10-09 NOTE — ED Provider Notes (Signed)
 [place] [facility]PARTMENT AT [facility] Provider Note   CSN: [id number] Arrival date & time: 10/09/23  1348     History  Chief Complaint  Patient presents with   Gait Problem   Blurred Vision    Lawrence Reynolds is a 83 y.o. male.  He has a history of dementia, level 5 caveat.  Also has arthritis, elevated blood pressure, paroxysmal A-fib.  He is not on a blood thinner.  He was well this morning.  After returning from [employer] shopping he was complaining of feeling unwell.  Wife says he was unsteady on his feet and complained of blurry vision.  Patient does admit that his vision was blurry.  He denies any headache chest pain abdominal pain numbness or weakness.  She said he normally walks slow but is steady.  The history is provided by the patient and the spouse.  Dizziness Quality:  Imbalance Onset quality:  Sudden Duration:  2 hours Timing:  Constant Progression:  Unchanged Chronicity:  New Relieved by:  None tried Associated symptoms: vision changes   Associated symptoms: no chest pain, no headaches and no vomiting        Home Medications Prior to Admission medications   Medication Sig Start Date End Date Taking? Authorizing Provider  butalbital-acetaminophen-caffeine (FIORICET, ESGIC) 50-325-40 MG tablet Take 1 tablet by mouth every 6 (six) hours as needed for headache. 12/24/17   Dhungel, Theda Belfast, MD  cholecalciferol (VITAMIN D) 1000 units tablet Take 2,000 Units by mouth daily.    [provider]  donepezil (ARICEPT) 10 MG tablet Take 1 tablet by mouth daily. 11/21/17   [provider]  EPINEPHrine (EPIPEN) 0.3 mg/0.3 mL SOAJ injection Inject 1 mL (1 mg total) into the muscle once. 04/06/13   Wilson Singer, MD  gabapentin (NEURONTIN) 300 MG capsule Take 300 mg by mouth 2 (two) times daily.  01/13/15   [provider]  HYDROcodone-acetaminophen (NORCO/VICODIN) 5-325 MG tablet Take 1-2 tablets by mouth every 4 (four) hours as  needed for moderate pain. 04/05/22   Erick Blinks, MD  primidone (MYSOLINE) 50 MG tablet Take 100 mg by mouth 2 (two) times daily.     [provider]  Tamsulosin HCl (FLOMAX) 0.4 MG CAPS Take 0.4 mg by mouth every evening.     [provider]  vitamin B-12 (CYANOCOBALAMIN) 1000 MCG tablet Take 1 tablet (1,000 mcg total) by mouth daily. 12/24/17   Dhungel, Nishant, MD  VITAMIN E PO Take 1 tablet by mouth daily.    [provider]      Allergies    Patient has no known allergies.    Review of Systems   Review of Systems  Cardiovascular:  Negative for chest pain.  Gastrointestinal:  Negative for vomiting.  Neurological:  Positive for dizziness. Negative for headaches.    Physical Exam Updated Vital Signs BP 127/75   Pulse 77   Temp 98.9 F (37.2 C) (Oral)   Resp 16   Ht 5\' 8"  (1.727 m)   Wt 128 kg   SpO2 96%   BMI 42.91 kg/m  Physical Exam Vitals and nursing note reviewed.  Constitutional:      General: He is not in acute distress.    Appearance: Normal appearance. He is well-developed.  HENT:     Head: Normocephalic and atraumatic.  Eyes:     Conjunctiva/sclera: Conjunctivae normal.  Cardiovascular:     Rate and Rhythm: Normal rate and regular rhythm.  Heart sounds: No murmur heard. Pulmonary:     Effort: Pulmonary effort is normal. No respiratory distress.     Breath sounds: Normal breath sounds.  Abdominal:     Palpations: Abdomen is soft.     Tenderness: There is no abdominal tenderness. There is no guarding or rebound.  Musculoskeletal:        General: No deformity.     Cervical back: Neck supple.  Skin:    General: Skin is warm and dry.     Capillary Refill: Capillary refill takes less than 2 seconds.  Neurological:     General: No focal deficit present.     Mental Status: He is alert. He is disoriented.     Cranial Nerves: No cranial nerve deficit.     Sensory: No sensory deficit.     Motor: No weakness.     ED Results /  Procedures / Treatments   Labs (all labs ordered are listed, but only abnormal results are displayed) Labs Reviewed  CBC - Abnormal; Notable for the following components:      Result Value   WBC 13.6 (*)    All other components within normal limits  DIFFERENTIAL - Abnormal; Notable for the following components:   Neutro Abs 8.1 (*)    Monocytes Absolute 1.1 (*)    Abs Immature Granulocytes 0.11 (*)    All other components within normal limits  COMPREHENSIVE METABOLIC PANEL - Abnormal; Notable for the following components:   Sodium 131 (*)    Calcium 8.7 (*)    All other components within normal limits  RAPID URINE DRUG SCREEN, HOSP PERFORMED - Abnormal; Notable for the following components:   Barbiturates POSITIVE (*)    All other components within normal limits  URINALYSIS, ROUTINE W REFLEX MICROSCOPIC - Abnormal; Notable for the following components:   Specific Gravity, Urine 1.038 (*)    All other components within normal limits  I-STAT CHEM 8, ED - Abnormal; Notable for the following components:   BUN 27 (*)    Calcium, Ion 1.05 (*)    All other components within normal limits  RESP PANEL BY RT-PCR (RSV, FLU A&B, COVID)  RVPGX2  ETHANOL  PROTIME-INR  APTT  CBG MONITORING, ED    EKG EKG Interpretation Date/Time:  Sunday October 09 2023 14:45:11 EDT Ventricular Rate:  77 PR Interval:  208 QRS Duration:  96 QT Interval:  372 QTC Calculation: 421 R Axis:   23  Text Interpretation: Sinus rhythm Low voltage, extremity and precordial leads Consider anterior infarct Nonspecific T abnormalities, lateral leads No significant change since prior 9/23 Confirmed by Meridee Score [phone] on 10/09/2023 2:47:30 PM  Radiology MR BRAIN WO CONTRAST Result Date: 10/09/2023 CLINICAL DATA:  Difficulty walking. Blurred vision. Dizziness. Neuro deficit, acute, stroke suspected. EXAM: MRI HEAD WITHOUT CONTRAST TECHNIQUE: Multiplanar, multiecho pulse sequences of the brain and surrounding  structures were obtained without intravenous contrast. COMPARISON:  CT head without contrast 10/09/2023. FINDINGS: Brain: The extra-axial fluid collection over the anterior right frontal convexity is confirmed, measuring up to 5 mm on coronal images. Mild diffuse dural thickening is present. No acute infarct or hemorrhage is present. Periventricular and scattered subcortical T2 hyperintensities bilaterally are mildly advanced for age. The ventricles are proportionate to the degree of atrophy. The brainstem and cerebellum are within normal limits. Midline structures are within normal limits. Vascular: Flow is present in the major intracranial arteries. Skull and upper cervical spine: The craniocervical junction is normal. Upper cervical spine is within  normal limits. Marrow signal is unremarkable. Sinuses/Orbits: Minimal fluid is present in the right sphenoid sinus. Small mastoid effusions are present. No obstructing nasopharyngeal lesion is present. The paranasal sinuses and mastoid air cells are otherwise clear. The globes and orbits are within normal limits. Other: IMPRESSION: 1. 5 mm extra-axial fluid collection over the anterior right frontal convexity is confirmed, measuring up to 5 mm on coronal images. 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 2. Mild diffuse dural thickening is nonspecific, but can be seen in the setting of intracranial hypotension. Question recent lumbar puncture. 3. No acute infarct or other acute intracranial abnormality. 4. Periventricular and scattered subcortical T2 hyperintensities bilaterally are mildly advanced for age. This likely reflects the sequela of chronic microvascular ischemia. 5. Small mastoid effusions bilaterally. No obstructing nasopharyngeal lesion is present. Electronically Signed   By: Marin Roberts M.D.   On: 10/09/2023 16:05   DG Chest Port 1 View Result Date: 10/09/2023 CLINICAL DATA:  cough EXAM: PORTABLE CHEST 1 VIEW COMPARISON:   April 07, 2022; April 04, 2022 FINDINGS: Evaluation is limited by low lung volume radiograph. The cardiomediastinal silhouette is unchanged in contour.Atherosclerotic calcifications. Unchanged elevation of the RIGHT hemidiaphragm. Unchanged background of chronic lung markings. No pleural effusion. No pneumothorax. No acute pleuroparenchymal abnormality. IMPRESSION: No acute cardiopulmonary abnormality. Electronically Signed   By: Meda Klinefelter M.D.   On: 10/09/2023 14:49   CT ANGIO HEAD NECK W WO CM (CODE STROKE) Result Date: 10/09/2023 CLINICAL DATA:  Blurred vision.  Unsteady gait. EXAM: CT ANGIOGRAPHY HEAD AND NECK WITH AND WITHOUT CONTRAST TECHNIQUE: Multidetector CT imaging of the head and neck was performed using the standard protocol during bolus administration of intravenous contrast. Multiplanar CT image reconstructions and MIPs were obtained to evaluate the vascular anatomy. Carotid stenosis measurements (when applicable) are obtained utilizing NASCET criteria, using the distal internal carotid diameter as the denominator. RADIATION DOSE REDUCTION: This exam was performed according to the departmental dose-optimization program which includes automated exposure control, adjustment of the mA and/or kV according to patient size and/or use of iterative reconstruction technique. CONTRAST:  75mL OMNIPAQUE IOHEXOL 350 MG/ML SOLN COMPARISON:  CT head without contrast 10/09/2023. FINDINGS: CTA NECK FINDINGS Aortic arch: Atherosclerotic calcifications are present at the aortic arch and great vessel origins. No focal stenosis is present. No aneurysm or dissection is present. Right carotid system: The right common carotid artery is within normal limits. Mild atherosclerotic changes are present at the bifurcation. A high-grade stenosis is present in the proximal right ICA. More distal ICA is within normal limits. Left carotid system: The left common carotid artery is within normal limits. Atherosclerotic  changes are present at the carotid bifurcation. Moderate tortuosity is present in the cervical left ICA without focal stenosis. Vertebral arteries: The left vertebral artery is the dominant vessel. Both vertebral arteries originate from the subclavian arteries without significant stenosis. No significant stenosis is present in either vertebral artery in the neck. Skeleton: Multilevel degenerative changes are present in the cervical spine. No focal osseous lesions are present. Other neck: The soft tissues of the neck are otherwise unremarkable. Salivary glands are within normal limits. Thyroid is normal. No significant adenopathy is present. No focal mucosal or submucosal lesions are present. Upper chest: Centrilobular emphysematous changes are present both lungs. A ground-glass nodule in the right upper lobe has increased from 5 mm to 7 mm on image 20/4. Review of the MIP images confirms the above findings CTA HEAD FINDINGS Anterior circulation: Mild atherosclerotic calcifications  are present within the cavernous internal carotid arteries bilaterally without significant stenoses through the ICA termini. The A1 and M1 segments are normal. The anterior communicating artery is patent. The left A1 segment is dominant. The MCA bifurcations are normal bilaterally. The ACA and MCA branch vessels are within normal limits. The anterior communicating artery is patent. No aneurysm is present. Posterior circulation: The PICA origins are visualized and normal. The vertebrobasilar junction and basilar artery are normal. The superior cerebellar arteries are patent. Both posterior cerebral arteries originate from the basilar tip. The PCA branch vessels are normal bilaterally. Venous sinuses: The dural sinuses are patent. The straight sinus and deep cerebral veins are intact. Cortical veins are within normal limits. No significant vascular malformation is evident. Anatomic variants: None Review of the MIP images confirms the above  findings IMPRESSION: 1. 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 2. Moderate tortuosity of the cervical left ICA without focal stenosis. 3. No significant stenosis, aneurysm, or branch vessel occlusion within the Circle of Willis. 4. A ground-glass nodule in the right upper lobe has increased from 5 mm to 7 mm. Recommend follow-up non emergent CT of the chest with contrast. The above was relayed via text pager to Dr. Milon Dikes on 10/09/2023 at 14:45 . Electronically Signed   By: Marin Roberts M.D.   On: 10/09/2023 14:45   CT HEAD CODE STROKE WO CONTRAST Result Date: 10/09/2023 CLINICAL DATA:  Code stroke. Neuro deficit, acute, stroke suspected. Blurred vision and unsteady gait. Last known well at 11:30 a.m. EXAM: CT HEAD WITHOUT CONTRAST TECHNIQUE: Contiguous axial images were obtained from the base of the skull through the vertex without intravenous contrast. RADIATION DOSE REDUCTION: This exam was performed according to the departmental dose-optimization program which includes automated exposure control, adjustment of the mA and/or kV according to patient size and/or use of iterative reconstruction technique. COMPARISON:  CT head without contrast 04/04/2022. FINDINGS: Brain: A small extra-axial collection is slightly hyperdense to CSF over the right convexity. This measures up to 4 mm on coronal images at the foramen of Monro. No acute infarct, hemorrhage or mass lesion is present. The ventricles are proportionate to the degree of atrophy. White matter changes are stable. The brainstem and cerebellum are within normal limits. Midline structures are within normal limits. Vascular: Atherosclerotic calcifications are present within the cavernous internal carotid arteries bilaterally. No hyperdense vessel is present. Skull: Insert normal skull No significant extracranial soft tissue lesion is present. Sinuses/Orbits: Fluid levels are present within the sphenoid sinuses bilaterally. The paranasal  sinuses and mastoid air cells are otherwise clear. The globes and orbits are within normal limits. ASPECTS [facility] Stroke Program Early CT Score) - Ganglionic level infarction (caudate, lentiform nuclei, internal capsule, insula, M1-M3 cortex): 7/7 - Supraganglionic infarction (M4-M6 cortex): 3/3 Total score (0-10 with 10 being normal): 10/10 IMPRESSION: 1. Small extra-axial collection over the right convexity is slightly hyperdense to CSF. This measures up to 4 mm on coronal images at the foramen of Monro. This likely represents a small subdural hematoma, age indeterminate but likely not acute. 2. No acute infarct or other acute intracranial abnormality. 3. Stable atrophy and white matter disease. This likely reflects the sequela of chronic microvascular ischemia. 4. Aspects is 10/10. 5. Fluid levels within the sphenoid sinuses bilaterally. This likely represents acute sinusitis. The above was relayed via text pager to Dr. Wilford Corner on 10/09/2023 at 14:26. Electronically Signed   By: Marin Roberts M.D.   On: 10/09/2023 14:33  Procedures .Critical Care  Performed by: Terrilee Files, MD Authorized by: Terrilee Files, MD   Critical care provider statement:    Critical care time (minutes):  45   Critical care time was exclusive of:  Separately billable procedures and treating other patients   Critical care was necessary to treat or prevent imminent or life-threatening deterioration of the following conditions:  CNS failure or compromise   Critical care was time spent personally by me on the following activities:  Development of treatment plan with patient or surrogate, discussions with consultants, evaluation of patient's response to treatment, examination of patient, obtaining history from patient or surrogate, ordering and performing treatments and interventions, ordering and review of laboratory studies, ordering and review of radiographic studies, pulse oximetry, re-evaluation of patient's  condition and review of old charts   I assumed direction of critical care for this patient from another provider in my specialty: no       Medications Ordered in ED Medications  iohexol (OMNIPAQUE) 350 MG/ML injection 75 mL (75 mLs Intravenous Contrast Given 10/09/23 1430)  sodium chloride 0.9 % bolus 500 mL (500 mLs Intravenous Bolus 10/09/23 1614)    ED Course/ Medical Decision Making/ A&P Clinical Course as of 10/09/23 1702  Sun Oct 09, 2023  1420 Discussed case with neurology on-call Dr. Wilford Corner.  He is getting a CT head along with angio and MRI.  He is also recommending respiratory panel chest x-ray. [MB]    Clinical Course User Index [MB] Terrilee Files, MD                                 Medical Decision Making Amount and/or Complexity of Data Reviewed Labs: ordered. Radiology: ordered.   This patient complains of unsteady gait blurry vision; this involves an extensive number of treatment Options and is a complaint that carries with it a high risk of complications and morbidity. The differential includes stroke, bleed, seizure, metabolic derangement, encephalopathy, intoxication  I ordered, reviewed and interpreted labs, which included CBC with elevated white count, chemistries with low sodium, COVID and flu negative, urinalysis without signs of infection, alcohol negative I ordered imaging studies which included CT head, CT angio head and neck, chest x-ray, MRI brain and I independently    visualized and interpreted imaging which showed no acute infarct.  Awaiting final radiology readings Additional history obtained from patient's wife Previous records obtained and reviewed in epic including recent PCP and admission notes I consulted neurology Dr. Wilford Corner And discussed lab and imaging findings and discussed disposition.  Cardiac monitoring reviewed, sinus rhythm Social determinants considered, no significant barriers Critical Interventions: Complex workup of acute neurologic  complaints within the TNK window requiring bedside presence  After the interventions stated above, I reevaluated the patient and found patient to be hemodynamically stable Admission and further testing considered, awaiting final neurology recommendations.  Patient's care is signed out to Dr. Hyacinth Meeker to follow-up on neurology recommendations.  May need admission for further workup         Final Clinical Impression(s) / ED Diagnoses Final diagnoses:  Unsteady gait when walking    Rx / DC Orders ED Discharge Orders     None         Terrilee Files, MD 10/09/23 1706

## 2023-10-09 NOTE — ED Triage Notes (Signed)
 Wife reports pt having trouble walking and complaining of blurry vision.

## 2023-10-09 NOTE — ED Notes (Signed)
 Patient transported to MRI

## 2023-10-09 NOTE — Progress Notes (Signed)
 Code stroke activated @ 1357.  Dr. Wilford Corner with [facility] was paged @ 1403 and on camera @ 1404.  TSRN dismissed by Dr. Wilford Corner @ [phone].  No TNK.  Josiah Lobo BSN, Occupational hygienist

## 2023-10-09 NOTE — ED Provider Notes (Signed)
 This patient is a 83 year old male that presented with his spouse after they gone to the [facility], the patient said that he was feeling dizzy and having some visual changes none of which she could explain by the time he arrived and none of which she has at this time.  He has some history of dementia according to the spouse, the daughter is also here, they state that he looks like his normal self.  He has had a very unremarkable workup and I did discuss the MRI findings with both neurology and neurosurgery neither of which feel like this is contributory.  He does have a very small area of either a hygroma or a small sliver of fluid on the frontal lobe.  No need for follow-up according to neurosurgery.  Patient is well-appearing asymptomatic and stable for discharge at this time, the family expresses their understanding to the indications for return.   Eber Hong, MD 10/09/23 Mikle Bosworth

## 2023-10-09 NOTE — Progress Notes (Signed)
 Patient ID: Lawrence Reynolds, male   DOB: [birth year], 83 y.o.   MRN: [id number] BP 125/77   Pulse 79   Temp 98.8 F (37.1 C) (Oral)   Resp 17   Ht 5\' 8"  (1.727 m)   Wt 128 kg   SpO2 96%   BMI 42.91 kg/m  Films reviewed, specifically the Brain MRI. The fluid collection described by the radiologist is not clinically relevant. No operative indications in this gentleman. No follow up is needed by me, nor should a repeat scan be performed unless he has a new neurological deficit.

## 2023-10-09 NOTE — Consult Note (Signed)
 [facility] Telemedicine Consult   Requesting Provider: Dr. Charm Barges Consult Participants: Dr. Marthe Patch, Telespecialist RN Elnita Maxwell   bedside RN Bonita Quin Location of the provider: [place]  Location of the patient: [facility], ER  This consult was provided via telemedicine with 2-way video and audio communication. The patient/family was informed that care would be provided in this way and agreed to receive care in this manner.   Chief Complaint: "Staggering gait, blurred vision-code stroke"  HPI:  [facility] is a 83 y.o. male with hx of dementia, diverticulitis, headaches, presented to the emergency department for evaluation of difficulty walking, staggering gait and difficulty with his vision of sudden onset. Wife reports that they were out shopping, he woke up normal this morning, then right around 1130 when they were trying to go to [place], he said he does not feel right and could not get out of the car.  He was having trouble walking.  She did not think his speech was any different than usual-reports he has dementia and forgets even his grandchildren and dementia has been worsening.  She has been having a very much hard time taking care of him with his dementia. Code stroke was activated due to blurred vision complaints as well as gait ataxia of sudden onset.   Patient is an extremely poor historian.  His wife is also not a very great historian.  Says that he has been having speech trouble but then says that that been going on since his dementia has been progressing over the past many years.  Wife says she has been coughing a lot recently.   Past Medical History:  Diagnosis Date   BPH (benign prostatic hypertrophy)    Dementia (HCC)    Diverticulitis    Headache(784.0)     No current facility-administered medications for this encounter.  Current Outpatient Medications:    butalbital-acetaminophen-caffeine (FIORICET, ESGIC) 50-325-40 MG tablet, Take 1 tablet by  mouth every 6 (six) hours as needed for headache., Disp: 15 tablet, Rfl: 0   cholecalciferol (VITAMIN D) 1000 units tablet, Take 2,000 Units by mouth daily., Disp: , Rfl:    donepezil (ARICEPT) 10 MG tablet, Take 1 tablet by mouth daily., Disp: , Rfl: 1   EPINEPHrine (EPIPEN) 0.3 mg/0.3 mL SOAJ injection, Inject 1 mL (1 mg total) into the muscle once., Disp: 1 Device, Rfl: 6   gabapentin (NEURONTIN) 300 MG capsule, Take 300 mg by mouth 2 (two) times daily. , Disp: , Rfl: 0   HYDROcodone-acetaminophen (NORCO/VICODIN) 5-325 MG tablet, Take 1-2 tablets by mouth every 4 (four) hours as needed for moderate pain., Disp: 30 tablet, Rfl: 0   primidone (MYSOLINE) 50 MG tablet, Take 100 mg by mouth 2 (two) times daily. , Disp: , Rfl:    Tamsulosin HCl (FLOMAX) 0.4 MG CAPS, Take 0.4 mg by mouth every evening. , Disp: , Rfl:    vitamin B-12 (CYANOCOBALAMIN) 1000 MCG tablet, Take 1 tablet (1,000 mcg total) by mouth daily., Disp: 30 tablet, Rfl: 0   VITAMIN E PO, Take 1 tablet by mouth daily., Disp: , Rfl:     LKW: Wife says symptoms noted around 11:30 AM but could not give me a very clear last known well.  We will go with the 1130 time. IV thrombolysis given?: No, nonfocal exam IR Thrombectomy? No, no ELVO Modified Rankin Scale: 4-Needs assistance to walk and tend to bodily needs Time of teleneurologist evaluation: 2:04 PM  Exam: Vitals:   10/09/23 1441 10/09/23 1442  BP: 130/83   Pulse:  77  Resp:    Temp:    SpO2:  94%    General: Awake alert in no distress HEENT: Normocephalic atraumatic Lungs: Clear Cardiovascular: Regular rate rhythm Neurological exam He is awake alert seems oriented to self He could not tell me the current month, his age or the current date or why he is in the hospital Seemed to have difficulty explaining the picture on the stroke cards completely but was able to have simple conversations and was able to follow commands and repeat sentences. Cranial nerves II to XII  appear intact Motor examination did not reveal any drift in the upper extremities of the lower extremities Sensation appeared intact Coordination examination revealed no dysmetria            NIHSS components Score: Comment  1a Level of Conscious 0[x]  1[]  2[]  3[]         1b LOC Questions 0[]  1[]  2[x]           1c LOC Commands 0[x]  1[]  2[]           2 Best Gaze 0[x]  1[]  2[]           3 Visual 0[x]  1[]  2[]  3[]         4 Facial Palsy 0[]  1[x]  2[]  3[]         5a Motor Arm - left 0[x]  1[]  2[]  3[]  4[]  UN[]     5b Motor Arm - Right 0[x]  1[]  2[]  3[]  4[]  UN[]     6a Motor Leg - Left 0[x]  1[]  2[]  3[]  4[]  UN[]     6b Motor Leg - Right 0[x]  1[]  2[]  3[]  4[]  UN[]     7 Limb Ataxia 0[x]  1[]  2[]  3[]  UN[]       8 Sensory 0[x]  1[]  2[]  UN[]         9 Best Language 0[]  1[x]  2[]  3[]         10 Dysarthria 0[]  1[x]  2[]  UN[]         11 Extinct. and Inattention 0[x]  1[]  2[]           TOTAL: 5      Imaging Reviewed:  Personally reviewed CT head-no acute changes-4 mm subdural collection on the right-question chronic subdural hematoma. CT angiography head and neck with no ELVO but high-grade proximal right ICA stenosis.  No large vessel occlusion.  No significant posterior circulation stenosis.  Right upper lobe lung nodule increasing in size-recommended follow-up CT chest by radiology  Labs reviewed in epic and pertinent values follow: CBC    Component Value Date/Time   WBC 13.6 (H) 10/09/2023 1402   RBC 5.03 10/09/2023 1402   HGB 16.0 10/09/2023 1412   HCT 47.0 10/09/2023 1412   PLT 233 10/09/2023 1402   MCV 95.4 10/09/2023 1402   MCH 30.4 10/09/2023 1402   MCHC 31.9 10/09/2023 1402   RDW 13.5 10/09/2023 1402   LYMPHSABS 3.7 10/09/2023 1402   MONOABS 1.1 (H) 10/09/2023 1402   EOSABS 0.4 10/09/2023 1402   BASOSABS 0.1 10/09/2023 1402   CMP     Component Value Date/Time   NA 135 10/09/2023 1412   K 4.6 10/09/2023 1412   CL 99 10/09/2023 1412   CO2 27 04/07/2022 1932   GLUCOSE 99 10/09/2023 1412   BUN 27  (H) 10/09/2023 1412   CREATININE 1.20 10/09/2023 1412   CALCIUM 8.7 (L) 04/07/2022 1932   PROT 7.4 04/04/2022 0825   ALBUMIN 4.0 04/04/2022 0825   AST 29 04/04/2022 0825   ALT 24 04/04/2022 0825  ALKPHOS 65 04/04/2022 0825   BILITOT 0.9 04/04/2022 0825   GFRNONAA >60 04/07/2022 1932   GFRAA >60 12/24/2017 1610     Assessment: 83/M PMH Dementia, headaches, p/w complaints of staggering gait, double vision and not feeling good. Non focal exam on camera. 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 STAT MRI done to evaluate for stroke. No stroke on MRI. Subdural hygroma also visualized on MRI. He has been sick with cough etc for past few days. I would imagine that some kind of viral illness is contributing to him feelng unwell in the setting of poor brain reserve causing gait impairment and other symptoms. No stroke work up is needed.  Imp: Likely toxic metabolic encephalopathy from a acute systemic illness   Recommendations:  Check UA, CXR and labs - and correct as appropriate Subdural hygroma appears chronic - can curbside neurosurgery to see if they need any further imaging. Check B12, TSH - replete as needed. Plan relayed to Dr. Hyacinth Meeker in the ER.  -- Milon Dikes, MD Neurologist [facility] Pager: [phone]

## 2023-10-19 DIAGNOSIS — Z6841 Body Mass Index (BMI) 40.0 and over, adult: Secondary | ICD-10-CM | POA: Diagnosis not present

## 2023-10-19 DIAGNOSIS — G4733 Obstructive sleep apnea (adult) (pediatric): Secondary | ICD-10-CM | POA: Diagnosis not present

## 2023-10-19 DIAGNOSIS — G309 Alzheimer's disease, unspecified: Secondary | ICD-10-CM | POA: Diagnosis not present

## 2023-10-19 DIAGNOSIS — Z299 Encounter for prophylactic measures, unspecified: Secondary | ICD-10-CM | POA: Diagnosis not present

## 2024-01-16 DIAGNOSIS — I700 Atherosclerosis of aorta: Secondary | ICD-10-CM | POA: Diagnosis not present

## 2024-01-16 DIAGNOSIS — F0280 Dementia in other diseases classified elsewhere without behavioral disturbance: Secondary | ICD-10-CM | POA: Diagnosis not present

## 2024-01-16 DIAGNOSIS — Z20822 Contact with and (suspected) exposure to covid-19: Secondary | ICD-10-CM | POA: Diagnosis not present

## 2024-01-16 DIAGNOSIS — Z299 Encounter for prophylactic measures, unspecified: Secondary | ICD-10-CM | POA: Diagnosis not present

## 2024-01-16 DIAGNOSIS — G309 Alzheimer's disease, unspecified: Secondary | ICD-10-CM | POA: Diagnosis not present

## 2024-03-02 DIAGNOSIS — Z299 Encounter for prophylactic measures, unspecified: Secondary | ICD-10-CM | POA: Diagnosis not present

## 2024-03-02 DIAGNOSIS — Z0000 Encounter for general adult medical examination without abnormal findings: Secondary | ICD-10-CM | POA: Diagnosis not present

## 2024-03-02 DIAGNOSIS — G309 Alzheimer's disease, unspecified: Secondary | ICD-10-CM | POA: Diagnosis not present

## 2024-03-02 DIAGNOSIS — G4733 Obstructive sleep apnea (adult) (pediatric): Secondary | ICD-10-CM | POA: Diagnosis not present

## 2024-03-07 DIAGNOSIS — Z0000 Encounter for general adult medical examination without abnormal findings: Secondary | ICD-10-CM | POA: Diagnosis not present

## 2024-05-02 DIAGNOSIS — C44310 Basal cell carcinoma of skin of unspecified parts of face: Secondary | ICD-10-CM | POA: Diagnosis not present

## 2024-05-02 DIAGNOSIS — G309 Alzheimer's disease, unspecified: Secondary | ICD-10-CM | POA: Diagnosis not present

## 2024-05-02 DIAGNOSIS — J9611 Chronic respiratory failure with hypoxia: Secondary | ICD-10-CM | POA: Diagnosis not present

## 2024-05-02 DIAGNOSIS — Z299 Encounter for prophylactic measures, unspecified: Secondary | ICD-10-CM | POA: Diagnosis not present

## 2024-05-02 DIAGNOSIS — F0280 Dementia in other diseases classified elsewhere without behavioral disturbance: Secondary | ICD-10-CM | POA: Diagnosis not present

## 2024-06-19 ENCOUNTER — Telehealth: Payer: Self-pay | Admitting: *Deleted

## 2024-06-19 NOTE — Telephone Encounter (Signed)
 Received referral from Dr. Maree with [facility]. [phone] telephone/ [phone]~ fax  Reason for referral: skin lesion to back of hand  Referral declined as patient will need hand specialists.
